# Patient Record
Sex: Male | Born: 1955
Health system: Southern US, Community
[De-identification: ages and names within clinical notes are randomized; demographics above are authoritative.]

## PROBLEM LIST (undated history)

## (undated) DIAGNOSIS — F419 Anxiety disorder, unspecified: Secondary | ICD-10-CM

## (undated) DIAGNOSIS — N189 Chronic kidney disease, unspecified: Secondary | ICD-10-CM

## (undated) DIAGNOSIS — R972 Elevated prostate specific antigen [PSA]: Secondary | ICD-10-CM

## (undated) DIAGNOSIS — E119 Type 2 diabetes mellitus without complications: Secondary | ICD-10-CM

## (undated) DIAGNOSIS — C61 Malignant neoplasm of prostate: Secondary | ICD-10-CM

## (undated) DIAGNOSIS — E7800 Pure hypercholesterolemia, unspecified: Secondary | ICD-10-CM

## (undated) DIAGNOSIS — G4730 Sleep apnea, unspecified: Secondary | ICD-10-CM

## (undated) DIAGNOSIS — I10 Essential (primary) hypertension: Secondary | ICD-10-CM

## (undated) HISTORY — DX: Sleep apnea, unspecified: G47.30

## (undated) HISTORY — DX: Type 2 diabetes mellitus without complications: E11.9

## (undated) HISTORY — PX: COLONOSCOPY: SHX174

## (undated) HISTORY — DX: Chronic kidney disease, unspecified: N18.9

## (undated) HISTORY — PX: PROSTATE BIOPSY: SHX241

## (undated) HISTORY — PX: OTHER SURGICAL HISTORY: SHX169

---

## 1998-06-06 ENCOUNTER — Ambulatory Visit (HOSPITAL_COMMUNITY): Admission: RE | Admit: 1998-06-06 | Discharge: 1998-06-06 | Payer: Self-pay | Admitting: Gastroenterology

## 1999-01-26 ENCOUNTER — Ambulatory Visit: Admission: RE | Admit: 1999-01-26 | Discharge: 1999-01-26 | Payer: Self-pay | Admitting: Internal Medicine

## 1999-06-29 ENCOUNTER — Ambulatory Visit: Admission: RE | Admit: 1999-06-29 | Discharge: 1999-06-29 | Payer: Self-pay | Admitting: Internal Medicine

## 2011-02-02 ENCOUNTER — Ambulatory Visit
Admission: RE | Admit: 2011-02-02 | Discharge: 2011-02-02 | Disposition: A | Discharge status: Home / Self Care | Payer: Self-pay | Source: Ambulatory Visit | Attending: *Deleted | Admitting: *Deleted

## 2011-02-02 ENCOUNTER — Other Ambulatory Visit: Payer: Self-pay | Admitting: *Deleted

## 2011-02-02 DIAGNOSIS — M549 Dorsalgia, unspecified: Secondary | ICD-10-CM

## 2011-02-02 DIAGNOSIS — M542 Cervicalgia: Secondary | ICD-10-CM

## 2013-04-20 ENCOUNTER — Emergency Department (HOSPITAL_COMMUNITY)
Admission: EM | Admit: 2013-04-20 | Discharge: 2013-04-20 | Disposition: A | Discharge status: Home / Self Care | Payer: BC Managed Care – PPO | Attending: Emergency Medicine | Admitting: Emergency Medicine

## 2013-04-20 ENCOUNTER — Emergency Department (HOSPITAL_COMMUNITY): Payer: BC Managed Care – PPO

## 2013-04-20 ENCOUNTER — Encounter (HOSPITAL_COMMUNITY): Payer: Self-pay

## 2013-04-20 DIAGNOSIS — I10 Essential (primary) hypertension: Secondary | ICD-10-CM | POA: Insufficient documentation

## 2013-04-20 DIAGNOSIS — N1330 Unspecified hydronephrosis: Secondary | ICD-10-CM | POA: Insufficient documentation

## 2013-04-20 DIAGNOSIS — E7800 Pure hypercholesterolemia, unspecified: Secondary | ICD-10-CM | POA: Insufficient documentation

## 2013-04-20 DIAGNOSIS — F411 Generalized anxiety disorder: Secondary | ICD-10-CM | POA: Insufficient documentation

## 2013-04-20 DIAGNOSIS — Z79899 Other long term (current) drug therapy: Secondary | ICD-10-CM | POA: Insufficient documentation

## 2013-04-20 DIAGNOSIS — N132 Hydronephrosis with renal and ureteral calculous obstruction: Secondary | ICD-10-CM

## 2013-04-20 DIAGNOSIS — Z7982 Long term (current) use of aspirin: Secondary | ICD-10-CM | POA: Insufficient documentation

## 2013-04-20 DIAGNOSIS — N201 Calculus of ureter: Secondary | ICD-10-CM | POA: Insufficient documentation

## 2013-04-20 HISTORY — DX: Anxiety disorder, unspecified: F41.9

## 2013-04-20 HISTORY — DX: Pure hypercholesterolemia, unspecified: E78.00

## 2013-04-20 HISTORY — DX: Essential (primary) hypertension: I10

## 2013-04-20 LAB — COMPREHENSIVE METABOLIC PANEL
ALT: 30 U/L (ref 0–53)
BUN: 20 mg/dL (ref 6–23)
CO2: 22 mEq/L (ref 19–32)
Calcium: 10.1 mg/dL (ref 8.4–10.5)
Creatinine, Ser: 0.8 mg/dL (ref 0.50–1.35)
GFR calc Af Amer: >90 mL/min (ref >90)
GFR calc non Af Amer: >90 mL/min (ref >90)
Glucose, Bld: 152 mg/dL — ABNORMAL HIGH (ref 70–99)

## 2013-04-20 LAB — CBC WITH DIFFERENTIAL/PLATELET
Eosinophils Relative: 1 % (ref 0–5)
HCT: 40.4 % (ref 39.0–52.0)
Lymphocytes Relative: 19 % (ref 12–46)
Lymphs Abs: 1.8 10*3/uL (ref 0.7–4.0)
MCH: 32.5 pg (ref 26.0–34.0)
MCV: 88.8 fL (ref 78.0–100.0)
Monocytes Absolute: 0.4 10*3/uL (ref 0.1–1.0)
Monocytes Relative: 4 % (ref 3–12)
RBC: 4.55 MIL/uL (ref 4.22–5.81)
WBC: 9.6 10*3/uL (ref 4.0–10.5)

## 2013-04-20 LAB — URINE MICROSCOPIC-ADD ON

## 2013-04-20 LAB — URINALYSIS, ROUTINE W REFLEX MICROSCOPIC
Bilirubin Urine: NEGATIVE
Nitrite: NEGATIVE
Specific Gravity, Urine: 1.024 (ref 1.005–1.030)
Urobilinogen, UA: 0.2 mg/dL (ref 0.0–1.0)

## 2013-04-20 MED ORDER — HYDROMORPHONE HCL PF 1 MG/ML IJ SOLN
1.0000 mg | Freq: Once | INTRAMUSCULAR | Status: AC
  Administered 2013-04-20: 1 mg via INTRAVENOUS
  Filled 2013-04-20: qty 1

## 2013-04-20 MED ORDER — FENTANYL CITRATE 0.05 MG/ML IJ SOLN
50.0000 ug | Freq: Once | INTRAMUSCULAR | Status: AC
  Administered 2013-04-20: 50 ug via INTRAVENOUS
  Filled 2013-04-20: qty 2

## 2013-04-20 MED ORDER — OXYCODONE-ACETAMINOPHEN 5-325 MG PO TABS: 1.0000 | ORAL_TABLET | Freq: Four times a day (QID) | ORAL | Status: DC | PRN

## 2013-04-20 MED ORDER — TAMSULOSIN HCL 0.4 MG PO CAPS: 0.4000 mg | ORAL_CAPSULE | Freq: Every day | ORAL | Status: DC

## 2013-04-20 MED ORDER — ONDANSETRON HCL 4 MG PO TABS: 4.0000 mg | ORAL_TABLET | Freq: Three times a day (TID) | ORAL | Status: DC | PRN

## 2013-04-20 MED ORDER — ONDANSETRON HCL 4 MG/2ML IJ SOLN
4.0000 mg | Freq: Once | INTRAMUSCULAR | Status: AC
  Administered 2013-04-20: 4 mg via INTRAVENOUS
  Filled 2013-04-20: qty 2

## 2013-04-20 MED ORDER — KETOROLAC TROMETHAMINE 30 MG/ML IJ SOLN
30.0000 mg | Freq: Once | INTRAMUSCULAR | Status: AC
  Administered 2013-04-20: 30 mg via INTRAVENOUS
  Filled 2013-04-20: qty 1

## 2013-04-20 NOTE — ED Provider Notes (Signed)
I saw and evaluated the patient, reviewed the resident's note and I agree with the findings and plan.  Patient seen and examined. Here at the right side colicky flank pain concerning for kidney stones. Will order CT and await results  Toy Baker, MD 04/20/13 1811

## 2013-04-20 NOTE — ED Notes (Signed)
Pt c/o sudden onset of Right flank pain x2 hours, pt denies any problems with urinating, denies hx of kidney stones. Pt is pale and diaphoretic

## 2013-04-20 NOTE — ED Provider Notes (Signed)
History     CSN: [id number]  Arrival date & time 04/20/13  1623   First MD Initiated Contact with Patient 04/20/13 1739      Chief Complaint  Patient presents with  . Flank Pain    (Consider location/radiation/quality/duration/timing/severity/associated sxs/prior treatment) HPI Comments: Mr. Keziah is a 57 year old white male with PMH of HTN presenting to the ED today for acute onset right sided flank/hip pain x3 hours.  He says he was driving from work today and felt sudden sharp right sided pain, constant, 8/10, no improvement with 2 otc ibuprofen, and no other alleviating or exacerbating factors.  No N/V/D, fever, chills, dysuria, hematuria, abdominal pain, numbness and tingling, chest pain, or shortness of breath at this time.  He had a similar episode of sudden right sided pain last week which spontaneously resolved after a couple of hours but the pain was not as severe he claims.  He explains the pain feels deep into his muscle and no tenderness on palpation to the area.  He denies any recent trauma to the area, no rapid change in movement, no hx of renal stones.    Patient is a 57 y.o. male presenting with flank pain. The history is provided by the patient. No language interpreter was used.  Flank Pain This is a recurrent problem. The current episode started today. The problem occurs intermittently. The problem has been rapidly worsening. Associated symptoms include diaphoresis. Nothing aggravates the symptoms. He has tried NSAIDs for the symptoms. The treatment provided no relief.    Past Medical History  Diagnosis Date  . Hypertension   . High cholesterol   . Anxiety     Past Surgical History  Procedure Laterality Date  . Arm surgery      History reviewed. No pertinent family history.  History  Substance Use Topics  . Smoking status: Never Smoker   . Smokeless tobacco: Not on file  . Alcohol Use: No      Review of Systems  Constitutional: Positive for  diaphoresis.  HENT: Negative.   Eyes: Negative.   Respiratory: Negative.   Cardiovascular: Negative.   Gastrointestinal: Negative.   Endocrine: Negative.   Genitourinary: Positive for flank pain.       Right flank pain  Musculoskeletal: Negative.        Right flank pain  Skin: Negative.   Allergic/Immunologic: Negative.   Neurological: Negative.   Hematological: Negative.   Psychiatric/Behavioral: Negative.     Allergies  Codeine  Home Medications   Current Outpatient Rx  Name  Route  Sig  Dispense  Refill  . aspirin EC 81 MG tablet   Oral   Take 81 mg by mouth daily.         Marland Kitchen atorvastatin (LIPITOR) 80 MG tablet   Oral   Take 80 mg by mouth daily.         . Multiple Vitamin (MULTIVITAMIN WITH MINERALS) TABS   Oral   Take 1 tablet by mouth daily.         Marland Kitchen PARoxetine (PAXIL) 20 MG tablet   Oral   Take 20 mg by mouth every morning.         . ramipril (ALTACE) 5 MG tablet   Oral   Take 5 mg by mouth daily.         . ondansetron (ZOFRAN) 4 MG tablet   Oral   Take 1 tablet (4 mg total) by mouth every 8 (eight) hours as needed for nausea.  20 tablet   0   . oxyCODONE-acetaminophen (ROXICET) 5-325 MG per tablet   Oral   Take 1-2 tablets by mouth every 6 (six) hours as needed for pain.   20 tablet   0   . tamsulosin (FLOMAX) 0.4 MG CAPS   Oral   Take 1 capsule (0.4 mg total) by mouth daily after supper.   14 capsule   0     BP 178/82  Pulse 62  Temp(Src) 98.8 F (37.1 C) (Oral)  Resp 17  SpO2 97%  Physical Exam  Constitutional: He is oriented to person, place, and time. He appears well-developed and well-nourished.  HENT:  Head: Normocephalic and atraumatic.  Eyes: Conjunctivae and EOM are normal. Pupils are equal, round, and reactive to light.  Neck: Normal range of motion. Neck supple.  Cardiovascular: Normal rate, regular rhythm, normal heart sounds and intact distal pulses.   Pulmonary/Chest: Effort normal and breath sounds  normal.  Abdominal: Soft. Bowel sounds are normal. He exhibits no distension. There is tenderness.  Mild tenderness to deep palpation RLQ  Musculoskeletal: Normal range of motion. He exhibits no edema and no tenderness.  Neurological: He is alert and oriented to person, place, and time.  Skin: Skin is warm and dry. No rash noted.  Psychiatric: He has a normal mood and affect. His behavior is normal. Judgment and thought content normal.    ED Course  Procedures (including critical care time)  Labs Reviewed  CBC WITH DIFFERENTIAL - Abnormal; Notable for the following:    MCHC 36.6 (*)    All other components within normal limits  COMPREHENSIVE METABOLIC PANEL - Abnormal; Notable for the following:    Glucose, Bld 152 (*)    All other components within normal limits  URINALYSIS, ROUTINE W REFLEX MICROSCOPIC - Abnormal; Notable for the following:    APPearance HAZY (*)    Glucose, UA 100 (*)    Hgb urine dipstick MODERATE (*)    All other components within normal limits  URINE MICROSCOPIC-ADD ON - Abnormal; Notable for the following:    Squamous Epithelial / LPF FEW (*)    All other components within normal limits   Ct Abdomen Pelvis Wo Contrast  04/20/2013  *RADIOLOGY REPORT*  Clinical Data: Right flank pain for several hours  CT ABDOMEN AND PELVIS WITHOUT CONTRAST  Technique:  Multidetector CT imaging of the abdomen and pelvis was performed following the standard protocol without intravenous contrast.  Comparison: None.  Findings: The lung bases are clear.  The liver is unremarkable in the unenhanced state.  No calcified gallstones are seen.  The pancreas is normal in size and the pancreatic duct is not dilated. The adrenal glands and spleen are unremarkable. The stomach is fluid distended containing food debris.  The duodenum does not appear to cross midline, and the cecum is somewhat medially positioned, suggesting a degree of intestinal malrotation.  No left renal calculi are seen and  there is no left hydronephrosis noted.  However, there is moderate right hydronephrosis present with right hydroureter.  The right ureter is dilated to a point of partial obstruction by a 4 mm distal right ureteral calculus very near the right UV junction.  The urinary bladder is not well distended but is unremarkable.  The prostate is normal in size containing several calcifications.  No free fluid is seen within the pelvis. The appendix is unremarkable. Degenerative disc disease is noted at L3-4 and L4-5 levels.  IMPRESSION:  1.  Moderate right hydronephrosis  and hydroureter to a point of obstruction by a 4 mm distal right ureteral calculus near the right UV junction. 2.  No additional renal calculi are seen. 3.  Probable partial intestinal malrotation.  No obstruction.   Original Report Authenticated By: Dwyane Dee, M.D.      1. Ureteral stone with hydronephrosis       MDM  57 year old male presenting with right flank pain x3 hours. Found to have hydronephrosis and R ureteral calculus with obstruction on CT abdomen.   -CT abdomen and pelvis: Moderate right hydronephrosis and hydroureter to a point of obstruction by a 4 mm distal right ureteral calculus near the right UV junction.  Probable partial intestinal malrotation. No obstruction. -u/a: moderate Hgb, 11-20 rbc, negative nitrites and leukocytes -fentanyl IV x1 no relief -dilaudid 1mg  IV x1 -Toradol 30mg  IV x1 -pain now well controlled -d/c home with follow up advised with pcp and urology as soon as possible.  Percocet prn pain, zofran prn nausea, and tamsulosin.  Case discussed with Dr. Lonn Georgia, MD 04/20/13 2137

## 2013-04-20 NOTE — ED Notes (Signed)
C/O severe pain in his right  Flank. States that it began 3 hours ago. C/O some nausea and occasional diaphoresis. Denies any strain injury. Area is non-tender to palpation.  No abdominal tenderness on palpation. States that movement does not affect his pain level.  States that it just remains constant.

## 2013-04-21 NOTE — ED Provider Notes (Signed)
I saw and evaluated the patient, reviewed the resident's note and I agree with the findings and plan.  Toy Baker, MD 04/21/13 [phone]

## 2013-10-14 ENCOUNTER — Encounter: Payer: Self-pay | Admitting: Internal Medicine

## 2013-12-11 ENCOUNTER — Ambulatory Visit (AMBULATORY_SURGERY_CENTER): Payer: Self-pay | Admitting: *Deleted

## 2013-12-11 VITALS — Ht 71.5 in | Wt 214.2 lb

## 2013-12-11 DIAGNOSIS — Z1211 Encounter for screening for malignant neoplasm of colon: Secondary | ICD-10-CM

## 2013-12-11 MED ORDER — PEG-KCL-NACL-NASULF-NA ASC-C 100 G PO SOLR: ORAL | Status: DC

## 2013-12-11 NOTE — Progress Notes (Signed)
No egg or soy allergy  Pt had colonoscopy in 1999 with Dr. Oletta Lamas- release of info formed filled out and give to [place], [place]

## 2013-12-16 ENCOUNTER — Encounter: Payer: Self-pay | Admitting: Internal Medicine

## 2013-12-25 ENCOUNTER — Encounter: Payer: BC Managed Care – PPO | Admitting: Internal Medicine

## 2014-02-05 ENCOUNTER — Encounter: Payer: BC Managed Care – PPO | Admitting: Internal Medicine

## 2014-03-09 ENCOUNTER — Encounter: Payer: BC Managed Care – PPO | Admitting: Internal Medicine

## 2014-03-09 ENCOUNTER — Ambulatory Visit (AMBULATORY_SURGERY_CENTER): Payer: BC Managed Care – PPO | Admitting: Internal Medicine

## 2014-03-09 ENCOUNTER — Encounter: Payer: Self-pay | Admitting: Internal Medicine

## 2014-03-09 VITALS — BP 132/70 | HR 50 | Temp 98.7°F | Resp 16 | Ht 71.5 in | Wt 214.0 lb

## 2014-03-09 DIAGNOSIS — D126 Benign neoplasm of colon, unspecified: Secondary | ICD-10-CM

## 2014-03-09 DIAGNOSIS — Z800 Family history of malignant neoplasm of digestive organs: Secondary | ICD-10-CM

## 2014-03-09 DIAGNOSIS — Z1211 Encounter for screening for malignant neoplasm of colon: Secondary | ICD-10-CM

## 2014-03-09 MED ORDER — SODIUM CHLORIDE 0.9 % IV SOLN: 500.0000 mL | INTRAVENOUS | Status: DC

## 2014-03-09 NOTE — Patient Instructions (Signed)
YOU HAD AN ENDOSCOPIC PROCEDURE TODAY AT THE [facility]: Refer to the procedure report that was given to you for any specific questions about what was found during the examination.  If the procedure report does not answer your questions, please call your gastroenterologist to clarify.  If you requested that your care partner not be given the details of your procedure findings, then the procedure report has been included in a sealed envelope for you to review at your convenience later.  YOU SHOULD EXPECT: Some feelings of bloating in the abdomen. Passage of more gas than usual.  Walking can help get rid of the air that was put into your GI tract during the procedure and reduce the bloating. If you had a lower endoscopy (such as a colonoscopy or flexible sigmoidoscopy) you may notice spotting of blood in your stool or on the toilet paper. If you underwent a bowel prep for your procedure, then you may not have a normal bowel movement for a few days.  DIET: Your first meal following the procedure should be a light meal and then it is ok to progress to your normal diet.  A half-sandwich or bowl of soup is an example of a good first meal.  Heavy or fried foods are harder to digest and may make you feel nauseous or bloated.  Likewise meals heavy in dairy and vegetables can cause extra gas to form and this can also increase the bloating.  Drink plenty of fluids but you should avoid alcoholic beverages for 24 hours.  ACTIVITY: Your care partner should take you home directly after the procedure.  You should plan to take it easy, moving slowly for the rest of the day.  You can resume normal activity the day after the procedure however you should NOT DRIVE or use heavy machinery for 24 hours (because of the sedation medicines used during the test).    SYMPTOMS TO REPORT IMMEDIATELY: A gastroenterologist can be reached at any hour.  During normal business hours, 8:30 AM to 5:00 PM Monday through Friday,  call [phone].  After hours and on weekends, please call the GI answering service at [phone] who will take a message and have the physician on call contact you.   Following lower endoscopy (colonoscopy or flexible sigmoidoscopy):  Excessive amounts of blood in the stool  Significant tenderness or worsening of abdominal pains  Swelling of the abdomen that is new, acute  Fever of 100F or higher    FOLLOW UP: If any biopsies were taken you will be contacted by phone or by letter within the next 1-3 weeks.  Call your gastroenterologist if you have not heard about the biopsies in 3 weeks.  Our staff will call the home number listed on your records the next business day following your procedure to check on you and address any questions or concerns that you may have at that time regarding the information given to you following your procedure. This is a courtesy call and so if there is no answer at the home number and we have not heard from you through the emergency physician on call, we will assume that you have returned to your regular daily activities without incident.  SIGNATURES/CONFIDENTIALITY: You and/or your care partner have signed paperwork which will be entered into your electronic medical record.  These signatures attest to the fact that that the information above on your After Visit Summary has been reviewed and is understood.  Full responsibility of the confidentiality   of this discharge information lies with you and/or your care-partner.     

## 2014-03-09 NOTE — Progress Notes (Signed)
Called to room to assist during endoscopic procedure.  Patient ID and intended procedure confirmed with present staff. Received instructions for my participation in the procedure from the performing physician.  

## 2014-03-09 NOTE — Op Note (Addendum)
[place]  [address]. [place], [address]   COLONOSCOPY PROCEDURE REPORT  PATIENT: Ronnie Brewer, Ronnie Brewer  MR#: [id number] BIRTHDATE: 06-25-1956 , 48  yrs. old GENDER: Male ENDOSCOPIST: Lafayette Dragon, MD REFERRED EX:[id number] Avva, M.D. PROCEDURE DATE:  03/09/2014 PROCEDURE:   Colonoscopy with cold biopsy polypectomy First Screening Colonoscopy - Avg.  risk and is 50 yrs.  old or older - No.  Prior Negative Screening - Now for repeat screening. 10 or more years since last screening Prior Negative Screening - Now for repeat screening.  Above average risk  History of Adenoma - Now for follow-up colonoscopy & has been > or = to 3 yrs.  N/A Polyps Removed Today? Yes. ASA CLASS:   Class II INDICATIONS:Patient's immediate family history of colon cancer and mother with colon cancer.  Prior colonoscopy in 1999 and 2006 O-.  MEDICATIONS: MAC sedation, administered by CRNA and propofol (Diprivan) 450mg  IV  DESCRIPTION OF PROCEDURE:   After the risks benefits and alternatives of the procedure were thoroughly explained, informed consent was obtained.  A digital rectal exam revealed no abnormalities of the rectum.   The LB [id number]  endoscope was introduced through the anus and advanced to the cecum, which was identified by both the appendix and ileocecal valve. No adverse events experienced.   The quality of the prep was good, using MoviPrep  The instrument was then slowly withdrawn as the colon was fully examined.      COLON FINDINGS: Two sessile polyps ranging between 3-76mm in size were found in the sigmoid colon and ascending colon.  A polypectomy was performed with cold forceps.  The resection was complete and the polyp tissue was completely retrieved.  Retroflexed views revealed no abnormalities. The time to cecum=10 minutes 56 seconds. Withdrawal time=7 minutes 59 seconds.  The scope was withdrawn and the procedure completed. COMPLICATIONS: There were  no complications.  ENDOSCOPIC IMPRESSION: Two sessile polyps ranging between 3-79mm in size were found in the sigmoid colon and ascending colon; polypectomy was performed with cold forceps  RECOMMENDATIONS: 1.  Await pathology results 2.  high fiber diet Recall colonoscopy in 5 years   eSigned:  Lafayette Dragon, MD 03/09/2014 3:23 PM Revised: 03/09/2014 3:23 PMAddendum: we were unble to capture photodocumentation of the cecum and ileocecal valve which were fully visualized  cc:

## 2014-03-10 ENCOUNTER — Telehealth: Payer: Self-pay | Admitting: *Deleted

## 2014-03-10 NOTE — Telephone Encounter (Signed)
Left message that we called for f/u 

## 2014-03-16 ENCOUNTER — Encounter: Payer: Self-pay | Admitting: Internal Medicine

## 2018-02-06 ENCOUNTER — Encounter: Payer: Self-pay | Admitting: Neurology

## 2018-02-09 ENCOUNTER — Encounter: Payer: Self-pay | Admitting: Neurology

## 2018-02-10 ENCOUNTER — Ambulatory Visit: Payer: Self-pay | Admitting: Neurology

## 2018-02-10 ENCOUNTER — Encounter: Payer: Self-pay | Admitting: Neurology

## 2018-02-10 VITALS — BP 110/59 | HR 65 | Ht 71.0 in | Wt 195.0 lb

## 2018-02-10 DIAGNOSIS — G4733 Obstructive sleep apnea (adult) (pediatric): Secondary | ICD-10-CM

## 2018-02-10 DIAGNOSIS — G4739 Other sleep apnea: Secondary | ICD-10-CM

## 2018-02-10 DIAGNOSIS — Z9989 Dependence on other enabling machines and devices: Secondary | ICD-10-CM

## 2018-02-10 DIAGNOSIS — E1165 Type 2 diabetes mellitus with hyperglycemia: Secondary | ICD-10-CM

## 2018-02-10 DIAGNOSIS — G4731 Primary central sleep apnea: Secondary | ICD-10-CM

## 2018-02-10 DIAGNOSIS — I119 Hypertensive heart disease without heart failure: Secondary | ICD-10-CM

## 2018-02-10 NOTE — Progress Notes (Signed)
SLEEP MEDICINE CLINIC   Provider:  Larey Seat, M D  Primary Care Physician:  Prince Solian, MD   Referring Provider: Prince Solian, MD   Chief Complaint  Patient presents with  . New Patient (Initial Visit)    pt alone, rm  11, cpap user for 25 years. pt has only had 2 machines in the last 2 years current machine is about 63 years old. [facility] is where he gets supplies    HPI:  Ronnie Brewer is a 62 y.o. male , seen here in a referral from Dr. Dagmar Hait for an evaluation of sleep apnea.  He developed excessive daytime sleepiness many years ago. He has had 2 sleep studies : one  25 years ago at the hospital, and about 12 years ago another one. He now is in a position where he cannot get supplies for his now over 34 year old CPAP machine. Dr. Dagmar Hait had repeated an ONO with him before the second study, and felt that CPAP corrected his apnea for all these years sufficiently. His wife, [name], has been our patient and he chose to come here for this work up.   I have access to a compliance report, which shows that the patient is 100% compliant user including the last day of 3 March of this year.  He has used the machine 29 out of 30 days over 4 hours consecutively, average use of time is 6 hours and 12 minutes, set pressure is 10 cmH2O was 2 cm EPR, residual AHI is ideal 2.7 only.  He does have high air leaks currently and this may be related to the age of his mask.  I also noted that he has more residual central apneas and obstructive apneas this would indicate that he may have too high a pressure set.   What we are going to do is to test him again for his current level of apnea and then either to up titrate or invite him for a CPAP titration depending on his insurance.  He had recent labs and I reviewed these and his med list.   He has Nocturia, polydipsia, Diabetes type 2, had kidney stones. Pneumonia in 2016, Mitral regurgitation, LVH, normal EF %.   Chief complaint according to patient  : I need supplies  Sleep habits are as follows: The patient's usual bedtime is around midnight and he does not have trouble to fall asleep.  He usually watches TV before he retreats to the bedroom, which is cool, quiet and dark and he shares it with his wife. She has noted at times that Mr. Dorwart is snoring or at least audibly breathing in spite of using the CPAP compliantly.  He has no nocturia since Dr. Elsworth Soho started him on metformin, the urinary frequency was a response to glucosuria. He sleeps through the night, wakes spontanously at 7.30 AM, just before his alarm rings. Their  3rd eldest daughter ( 78 )  lives with them since she suffered a TBI in a MVA at age 68 . Her sisters all live close by.    Sleep medical history and family sleep history:  Diagnosed at age 43 with OSA, wife has OSA both use CPAP. Allergic rhinitis, hyperchol, hypertension, DM2.   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    Review of Systems: Out of a complete 14 system review, the patient complains of only the following symptoms, and all other reviewed systems are negative.  Snoring on  CPAP  Epworth score  6/ 24 on CPAP  , Fatigue severity score 12   , depression score not clinically significant now.    Social History   Socioeconomic History  . Marital status: Married    Spouse name: Not on file  . Number of children: Not on file  . Years of education: Not on file  . Highest education level: Not on file  Social Needs  . Financial resource strain: Not on file  . Food insecurity - worry: Not on file  . Food insecurity - inability: Not on file  . Transportation needs - medical: Not on file  . Transportation needs - non-medical: Not on file  Occupational History  . Not on file  Tobacco Use  . Smoking status: Never Smoker  . Smokeless tobacco: Never Used  Substance and Sexual Activity  . Alcohol use: No  . Drug use: No  . Sexual activity: Not on file  Other  Topics Concern  . Not on file  Social History Narrative  . Not on file    Family History  Problem Relation Age of Onset  . Colon cancer Mother   . Hypertension Mother   . Hypertension Father   . Nephrolithiasis Father   . Ulcers Father   . Diabetes Mellitus II Father   . Esophageal cancer Neg Hx   . Rectal cancer Neg Hx   . Stomach cancer Neg Hx     Past Medical History:  Diagnosis Date  . Anxiety   . Chronic kidney disease    hx kidney stones- 2014  . Diabetes mellitus without complication ([place])   . High cholesterol   . Hypertension   . Sleep apnea    wears CPAP    Past Surgical History:  Procedure Laterality Date  . arm surgery     1981  . COLONOSCOPY      Current Outpatient Medications  Medication Sig Dispense Refill  . aspirin EC 81 MG tablet Take 81 mg by mouth daily.    Marland Kitchen atorvastatin (LIPITOR) 80 MG tablet Take 80 mg by mouth daily.    . metFORMIN (GLUCOPHAGE) 500 MG tablet 1,000 mg 2 (two) times daily with breakfast and lunch.  6  . Multiple Vitamin (MULTIVITAMIN WITH MINERALS) TABS Take 1 tablet by mouth daily.    Marland Kitchen PARoxetine (PAXIL) 20 MG tablet Take 20 mg by mouth every morning.    . ramipril (ALTACE) 5 MG tablet Take 5 mg by mouth daily.    Marland Kitchen telmisartan (MICARDIS) 80 MG tablet Take 80 mg by mouth daily.  6   No current facility-administered medications for this visit.     Allergies as of 02/10/2018 - Review Complete 02/10/2018  Allergen Reaction Noted  . Codeine Nausea And Vomiting 04/20/2013    Vitals: BP (!) 110/59   Pulse 65   Ht 5\' 11"  (1.803 m)   Wt 195 lb (88.5 kg)   BMI 27.20 kg/m  Last Weight:  Wt Readings from Last 1 Encounters:  02/10/18 195 lb (88.5 kg)   VQM:GQQP mass index is 27.2 kg/m.     Last Height:   Ht Readings from Last 1 Encounters:  02/10/18 5\' 11"  (1.803 m)    Physical exam:  General: The patient is awake, alert and appears not in acute distress. The patient is well groomed. Head: Normocephalic,  atraumatic. Neck is supple. Mallampati 3- tip of uvula touches tongue ground. ,  neck circumference:17.5. Nasal airflow congested , bilateral  TMJ click ,  no bruxism marks.  Retrognathia is not seen.  Cardiovascular:  Regular rate and rhythm , without  murmurs or carotid bruit, and without distended neck veins. Respiratory: Lungs are clear to auscultation.Skin:  Without evidence of edema, or rash. Trunk: BMI is 27.2. The patient's posture is erect.   Neurologic exam :The patient is awake and alert, oriented to place and time.  Memory subjective described as intact.   Attention span & concentration ability appears normal.  Speech is fluent,  without  dysarthria, mild dysphonia and no aphasia.  Mood and affect are appropriate.  Cranial nerves: Pupils are equal and briskly reactive to light and accomodation. Funduscopic exam without evidence of pallor or edema- .  Extraocular movements  in vertical and horizontal planes intact and without nystagmus. Visual fields by finger perimetry are intact. Hearing to finger rub intact. Facial sensation intact to fine touch. Facial motor strength is symmetric and tongue and uvula move midline. Shoulder shrug was symmetrical.   Motor exam: Normal tone, muscle bulk and symmetric strength in all extremities. Strong grip bilateral.  Sensory:  Fine touch, pinprick and vibration / Proprioception tested in the upper extremities was normal. Coordination: Rapid alternating movements / Finger-to-nose maneuver  normal without evidence of ataxia, dysmetria or tremor. Gait and station:  Raises without bracing himself Patient walks without assistive device.Tandem gait is unfragmented. Turns with 3 Steps. Deep tendon reflexes: in the  upper and lower extremities are symmetric and intact.  Assessment:  After physical and neurologic examination, review of laboratory studies,  Personal review of imaging studies, reports of other /same  Imaging studies, results of  polysomnography and / or neurophysiology testing and pre-existing records as far as provided in visit., my assessment is   1)  He needs a new sleep study to confirm his diagnosis is still present. His machine works fine and was WiFi accessible for data. He is highly compliant user.  His snoring on CPAP could indicate his pressures not be high enough or his mask not longer fitting. He has lost weight on metformin 12-15  pounds. This may contribute to a looser fit.   2) AETNA- will need a HST -  and confirm level of current apnea. Is there really central apnea present?   3) will not necessarily need a new CPAP, but an auto titration for a month to allow resetting the current machine.   The patient was advised of the nature of the diagnosed disorder , the treatment options and the  risks for general health and wellness arising from not treating the condition.  I spent more than 45  minutes of face to face time with the patient. Greater than 50% of time was spent in counseling and coordination of care. We have discussed the diagnosis and differential and I answered the patient's questions.        No orders of the defined types were placed in this encounter.    No orders of the defined types were placed in this encounter.    No Follow-up on file.   Larey Seat, MD 7/0/1779, 39:03 AM  Certified in Neurology by ABPN Certified in [place] by [facility] [address] [place], [place] [address]

## 2018-03-12 ENCOUNTER — Ambulatory Visit (INDEPENDENT_AMBULATORY_CARE_PROVIDER_SITE_OTHER): Payer: 59 | Admitting: Neurology

## 2018-03-12 DIAGNOSIS — E1165 Type 2 diabetes mellitus with hyperglycemia: Secondary | ICD-10-CM

## 2018-03-12 DIAGNOSIS — G4739 Other sleep apnea: Secondary | ICD-10-CM

## 2018-03-12 DIAGNOSIS — Z9989 Dependence on other enabling machines and devices: Secondary | ICD-10-CM

## 2018-03-12 DIAGNOSIS — G4733 Obstructive sleep apnea (adult) (pediatric): Secondary | ICD-10-CM

## 2018-03-12 DIAGNOSIS — I119 Hypertensive heart disease without heart failure: Secondary | ICD-10-CM

## 2018-03-12 DIAGNOSIS — G4731 Primary central sleep apnea: Secondary | ICD-10-CM

## 2018-03-14 DIAGNOSIS — G4739 Other sleep apnea: Secondary | ICD-10-CM | POA: Insufficient documentation

## 2018-03-14 DIAGNOSIS — I119 Hypertensive heart disease without heart failure: Secondary | ICD-10-CM | POA: Insufficient documentation

## 2018-03-14 DIAGNOSIS — E1165 Type 2 diabetes mellitus with hyperglycemia: Secondary | ICD-10-CM | POA: Insufficient documentation

## 2018-03-14 DIAGNOSIS — G4731 Primary central sleep apnea: Secondary | ICD-10-CM

## 2018-03-14 NOTE — Procedures (Signed)
[facility] [place], [place] [address] NAME:  Ronnie Brewer                                                                    DOB: [birth year] MEDICAL RECORD No:  [id number]                                               DOS: 03/12/2018 REFERRING PHYSICIAN: Prince Solian, M.D. STUDY PERFORMED: Home Sleep Study by apnea link HISTORY:  Ronnie Brewer is a 62 y.o. male, who developed excessive daytime sleepiness and has had 2 sleep studies: one 25 years ago and another 12 years ago, both confirming OSA. He now is in a position where he cannot get supplies for his now over 83 year old CPAP machine. Dr. Dagmar Hait had repeated an ONO with him before the second study, and felt that CPAP corrected his apnea for all these years sufficiently. His wife, [name], has been our patient and she reports he snores "through the CPAP" now and has a high air leak.  I have access to a CPAP compliance report, which shows that the patient is a highly compliant user including the last day of 3 March of this year.  He has used the machine 29 out of 30 days over 4 hours ( 97%) consecutively, average use of time is 6 hours and 12 minutes, set pressure is 10 cmH2O with 2 cm EPR, residual AHI is ideal at 2.7/h only.  .  We are now to test him again to confirm his current level of apnea and then either to up titrate or invite him for a CPAP titration depending on his insurance. Epworth score 6/ 24 on CPAP, BMI: 27.2  STUDY RESULTS:  Total Recording Time: 10 hours, 40 minutes. Total Apnea/Hypopnea Index (AHI):  21.8 /h, RDI was 25.1 /h. Average Oxygen Saturation:  89 %; Lowest Oxygen Desaturation: 79 %  Total Time Oxygen Saturation below 89% was 236 minutes!  Average Heart Rate: 49 bpm (between 43 and 122 bpm).  IMPRESSION: There is still moderate and mostly obstructive apnea noted, accompanied by moderate snoring. Central apnea amounted to 22 % ,unclassified apnea to 20%, and  obstructive apnea to 59%  The apnea link record shows hypoxemia for a prolonged period of time, and hypoxemia cannot be treated with dental devices or the inspire apparatus.  RECOMMENDATION: CPAP therapy should be continued in a patient with hypoxemia and documented hypertensive heart disease - The patient will be provided with an auto titration CPAP , pressure settings between 6 and 12 cm water, 2 cm EPR and with a mask of his choice. If treatment emergent central apneas are seen on future download will likely change to BiPAP.   I certify that I have reviewed the raw data recording prior to the issuance of this report in accordance with the standards of the American Academy of Sleep Medicine (AASM).  Larey Seat, M.D.  03-14-2018 Medical Director of [facility] at [facility], accredited by the AASM. Diplomat of the ABPN and ABSM.

## 2018-03-14 NOTE — Addendum Note (Signed)
Addended by: Larey Seat on: 03/14/2018 12:00 PM   Modules accepted: Orders

## 2018-03-17 ENCOUNTER — Telehealth: Payer: Self-pay

## 2018-03-17 NOTE — Telephone Encounter (Signed)
-----   Message from Larey Seat, MD sent at 03/14/2018 12:00 PM EDT ----- IMPRESSION: There is still moderate and mostly obstructive apnea  noted, accompanied by moderate snoring. Central apnea amounted to  22 % ,unclassified apnea to 20%, and obstructive apnea to 59%  The apnea link record shows hypoxemia for a prolonged period of  time, and hypoxemia cannot be treated with dental devices or the  inspire apparatus.  RECOMMENDATION: CPAP therapy should be continued in a patient  with hypoxemia and documented hypertensive heart disease - The  patient will be provided with an auto titration CPAP , pressure  settings between 6 and 12 cm water, 2 cm EPR and with a mask of  his choice.   We need to watch out for treatment emergent central apneas while on auto PAP> CD

## 2018-03-17 NOTE — Telephone Encounter (Signed)
I called pt to discuss his sleep study results. No answer, left a message asking him to call me back. 

## 2018-03-18 NOTE — Telephone Encounter (Signed)
I called pt. I advised pt that Dr. Brett Fairy reviewed their sleep study results and found that pt still has moderate osa with hypoxemia and some central apnea component. Dr. Brett Fairy recommends that pt start an auto pap at home and we will watch for central apnea while on auto pap. I reviewed PAP compliance expectations with the pt. Pt is agreeable to starting an auto-PAP. I advised pt that an order will be sent to a DME, [facility], and [facility] will call the pt within about one week after they file with the pt's insurance. [facility] will show the pt how to use the machine, fit for masks, and troubleshoot the auto-PAP if needed. A follow up appt was made for insurance purposes with Hoyle Sauer, NP on 06/25/18 at 8:45am. Pt verbalized understanding to arrive 15 minutes early and bring their auto-PAP. A letter with all of this information in it will be mailed to the pt as a reminder. I verified with the pt that the address we have on file is correct. Pt verbalized understanding of results. Pt had no questions at this time but was encouraged to call back if questions arise.

## 2018-06-23 ENCOUNTER — Encounter: Payer: Self-pay | Admitting: Nurse Practitioner

## 2018-06-24 NOTE — Progress Notes (Signed)
[facility]  PATIENT: UNO ESAU DOB: 1956-08-05   REASON FOR VISIT: Obstructive sleep apnea follow-up for AutoPap compliance HISTORY FROM: Patient   HISTORY OF PRESENT ILLNESS:UPDATE 7/17/2019CM Mr. Pfalzgraf, 62 year old male returns for follow-up with obstructive sleep apnea.  He is received a new machine and is here for CPAP compliance.  He is having no problems with his machine.  Compliance data dated 05/25/2018 to 06/23/2018 shows compliance greater than 4 hours at 100%.  Average usage 5 hours 58 minutes.  Set pressure 6 to 12 cm.  EPR level 2 AHI 1.4 he returns for reevaluation. 3/4/19CDKenneth R Mckamie is a 62 y.o. male , seen here in a referral from Dr. Dagmar Hait for an evaluation of sleep apnea.  He developed excessive daytime sleepiness many years ago. He has had 2 sleep studies : one  25 years ago at the hospital, and about 12 years ago another one. He now is in a position where he cannot get supplies for his now over 46 year old CPAP machine. Dr. Dagmar Hait had repeated an ONO with him before the second study, and felt that CPAP corrected his apnea for all these years sufficiently. His wife, [name], has been our patient and he chose to come here for this work up.   I have access to a compliance report, which shows that the patient is 100% compliant user including the last day of 3 March of this year.  He has used the machine 29 out of 30 days over 4 hours consecutively, average use of time is 6 hours and 12 minutes, set pressure is 10 cmH2O was 2 cm EPR, residual AHI is ideal 2.7 only.  He does have high air leaks currently and this may be related to the age of his mask.  I also noted that he has more residual central apneas and obstructive apneas this would indicate that he may have too high a pressure set.   What we are going to do is to test him again for his current level of apnea and then either to up titrate or invite him for a CPAP titration depending on his  insurance.  He had recent labs and I reviewed these and his med list.   He has Nocturia, polydipsia, Diabetes type 2, had kidney stones. Pneumonia in 2016, Mitral regurgitation, LVH, normal EF %.   Chief complaint according to patient : I need supplies  Sleep habits are as follows: The patient's usual bedtime is around midnight and he does not have trouble to fall asleep.  He usually watches TV before he retreats to the bedroom, which is cool, quiet and dark and he shares it with his wife. She has noted at times that Mr. Penny is snoring or at least audibly breathing in spite of using the CPAP compliantly.  He has no nocturia since Dr. Elsworth Soho started him on metformin, the urinary frequency was a response to glucosuria. He sleeps through the night, wakes spontanously at 7.30 AM, just before his alarm rings. Their  3rd eldest daughter ( 49 )  lives with them since she suffered a TBI in a MVA at age 73 . Her sisters all live close by.     REVIEW OF SYSTEMS: Full 14 system review of systems performed and notable only for those listed, all others are neg:  Constitutional: neg  Cardiovascular: neg Ear/Nose/Throat: neg  Skin: neg Eyes: neg Respiratory: neg Gastroitestinal: neg  Hematology/Lymphatic: neg  Endocrine: neg Musculoskeletal:neg Allergy/Immunology: neg Neurological: neg  Psychiatric: neg Sleep : Obstructive sleep apnea with CPAP   ALLERGIES: Allergies  Allergen Reactions  . Codeine Nausea And Vomiting    HOME MEDICATIONS: Outpatient Medications Prior to Visit  Medication Sig Dispense Refill  . aspirin EC 81 MG tablet Take 81 mg by mouth daily.    Marland Kitchen atorvastatin (LIPITOR) 80 MG tablet Take 80 mg by mouth daily.    . metFORMIN (GLUCOPHAGE) 500 MG tablet 1,000 mg 2 (two) times daily with breakfast and lunch.  6  . Multiple Vitamin (MULTIVITAMIN WITH MINERALS) TABS Take 1 tablet by mouth daily.    Marland Kitchen PARoxetine (PAXIL) 20 MG tablet Take 20 mg by mouth every morning.     . ramipril (ALTACE) 5 MG tablet Take 5 mg by mouth daily.    Marland Kitchen telmisartan (MICARDIS) 80 MG tablet Take 80 mg by mouth daily.  6   No facility-administered medications prior to visit.     PAST MEDICAL HISTORY: Past Medical History:  Diagnosis Date  . Anxiety   . Chronic kidney disease    hx kidney stones- 2014  . Diabetes mellitus without complication ([place])   . High cholesterol   . Hypertension   . Sleep apnea    wears CPAP    PAST SURGICAL HISTORY: Past Surgical History:  Procedure Laterality Date  . arm surgery     1981  . COLONOSCOPY      FAMILY HISTORY: Family History  Problem Relation Age of Onset  . Colon cancer Mother   . Hypertension Mother   . Hypertension Father   . Nephrolithiasis Father   . Ulcers Father   . Diabetes Mellitus II Father   . Esophageal cancer Neg Hx   . Rectal cancer Neg Hx   . Stomach cancer Neg Hx     SOCIAL HISTORY: Social History   Socioeconomic History  . Marital status: Married    Spouse name: Not on file  . Number of children: Not on file  . Years of education: Not on file  . Highest education level: Not on file  Occupational History    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  Social Needs  . Financial resource strain: Not on file  . Food insecurity:    Worry: Not on file    Inability: Not on file  . Transportation needs:    Medical: Not on file    Non-medical: Not on file  Tobacco Use  . Smoking status: Never Smoker  . Smokeless tobacco: Never Used  Substance and Sexual Activity  . Alcohol use: No  . Drug use: No  . Sexual activity: Not on file  Lifestyle  . Physical activity:    Days per week: Not on file    Minutes per session: Not on file  . Stress: Not on file  Relationships  . Social connections:    Talks on phone: Not on file    Gets together: Not on file    Attends religious service: Not on file    Active member of club or organization: Not on file    Attends meetings of clubs or organizations: Not  on file    Relationship status: Not on file  . Intimate partner violence:    Fear of current or ex partner: Not on file    Emotionally abused: Not on file    Physically abused: Not on file    Forced sexual activity: Not on file  Other Topics Concern  . Not on file  Social History Narrative  .  Not on file     PHYSICAL EXAM  Vitals:   06/25/18 0817  BP: (!) 94/50  Pulse: (!) 53  Weight: 193 lb 3.2 oz (87.6 kg)  Height: 5\' 11"  (1.803 m)   Body mass index is 26.95 kg/m.  Generalized: Well developed, in no acute distress  Head: normocephalic and atraumatic,. Oropharynx benign mallopatti3 Neck: Supple, circumference 17.5 Lungs clear Skin no peripheral edema Musculoskeletal: No deformity   Neurological examination   Mentation: Alert oriented to time, place, history taking. Attention span and concentration appropriate. Recent and remote memory intact.  Follows all commands speech and language fluent.   Cranial nerve II-XII: Pupils were equal round reactive to light extraocular movements were full, visual field were full on confrontational test. Facial sensation and strength were normal. hearing was intact to finger rubbing bilaterally. Uvula tongue midline. head turning and shoulder shrug were normal and symmetric.Tongue protrusion into cheek strength was normal. Motor: normal bulk and tone, full strength in the BUE, BLE, fine finger movements normal, no pronator drift. No focal weakness Sensory: normal and symmetric to light touch,  Coordination: finger-nose-finger, heel-to-shin bilaterally, no dysmetria Gait and Station: Rising up from seated position without assistance, normal stance,  moderate stride, good arm swing, smooth turning, able to perform tiptoe, and heel walking without difficulty. Tandem gait is steady  DIAGNOSTIC DATA (LABS, IMAGING, TESTING) - I reviewed patient records, labs, notes, testing and imaging myself where available.  Lab Results  Component Value  Date   WBC 9.6 04/20/2013   HGB 14.8 04/20/2013   HCT 40.4 04/20/2013   MCV 88.8 04/20/2013   PLT 186 04/20/2013      Component Value Date/Time   NA 141 04/20/2013 1642   K 3.7 04/20/2013 1642   CL 101 04/20/2013 1642   CO2 22 04/20/2013 1642   GLUCOSE 152 (H) 04/20/2013 1642   BUN 20 04/20/2013 1642   CREATININE 0.80 04/20/2013 1642   CALCIUM 10.1 04/20/2013 1642   PROT 7.8 04/20/2013 1642   ALBUMIN 5.2 04/20/2013 1642   AST 26 04/20/2013 1642   ALT 30 04/20/2013 1642   ALKPHOS 82 04/20/2013 1642   BILITOT 1.2 04/20/2013 1642   GFRNONAA >90 04/20/2013 1642   GFRAA >90 04/20/2013 1642    ASSESSMENT AND PLAN  62 y.o. year old male  has a past medical history of Anxiety, Chronic kidney disease, Diabetes mellitus without complication ([place]), High cholesterol, Hypertension, and Sleep apnea here to follow-up for CPAP compliance   CPAP compliance 100% Continue same settings Follow-up in 6 months then yearly Dennie Bible, [facility], [facility], [facility] [address] [place], [place] [address] [phone]

## 2018-06-25 ENCOUNTER — Encounter: Payer: Self-pay | Admitting: Nurse Practitioner

## 2018-06-25 ENCOUNTER — Ambulatory Visit: Payer: 59 | Admitting: Nurse Practitioner

## 2018-06-25 DIAGNOSIS — G4733 Obstructive sleep apnea (adult) (pediatric): Secondary | ICD-10-CM | POA: Diagnosis not present

## 2018-06-25 DIAGNOSIS — Z9989 Dependence on other enabling machines and devices: Secondary | ICD-10-CM | POA: Diagnosis not present

## 2018-06-25 NOTE — Patient Instructions (Signed)
CPAP compliance 100% Continue same settings Follow-up in 6 months then yearly

## 2018-07-11 NOTE — Progress Notes (Signed)
I agree with the assessment and plan as directed by NP .The patient is known to me .   Ronnie Clendenin, MD  

## 2018-08-13 NOTE — Progress Notes (Signed)
I agree with the assessment and plan as directed by NP .The patient is known to me .   Elmin Wiederholt, MD  

## 2018-10-06 DIAGNOSIS — G4733 Obstructive sleep apnea (adult) (pediatric): Secondary | ICD-10-CM | POA: Diagnosis not present

## 2018-11-06 DIAGNOSIS — G4733 Obstructive sleep apnea (adult) (pediatric): Secondary | ICD-10-CM | POA: Diagnosis not present

## 2018-11-19 DIAGNOSIS — Z23 Encounter for immunization: Secondary | ICD-10-CM | POA: Diagnosis not present

## 2018-12-06 DIAGNOSIS — G4733 Obstructive sleep apnea (adult) (pediatric): Secondary | ICD-10-CM | POA: Diagnosis not present

## 2019-01-05 ENCOUNTER — Encounter: Payer: Self-pay | Admitting: Neurology

## 2019-01-05 NOTE — Progress Notes (Signed)
[facility]  PATIENT: Ronnie Brewer DOB: [birth year]   REASON FOR VISIT: Obstructive sleep apnea follow-up for AutoPap compliance HISTORY FROM: Patient   HISTORY OF PRESENT ILLNESS:UPDATE 1/28/2020CM Ronnie Brewer, 63 year old male returns for follow-up with history of obstructive sleep apnea here for CPAP compliance.  He is having no problems compliance data dated 12/07/2018-01/05/2019 shows compliance greater than 4 hours at 100%.  Average usage 5 hours 59 minutes.  Set pressure 6 to 12 cm.  EPR level 2 AHI 1.7 he returns for reevaluation    UPDATE 7/17/2019CM Ronnie Brewer, 63 year old male returns for follow-up with obstructive sleep apnea.  He is received a new machine and is here for CPAP compliance.  He is having no problems with his machine.  Compliance data dated 05/25/2018 to 06/23/2018 shows compliance greater than 4 hours at 100%.  Average usage 5 hours 58 minutes.  Set pressure 6 to 12 cm.  EPR level 2 AHI 1.4 he returns for reevaluation. 3/4/19CDKenneth R Brewer is a 63 y.o. male , seen here in a referral from Ronnie Brewer for an evaluation of sleep apnea.  He developed excessive daytime sleepiness many years ago. He has had 2 sleep studies : one  25 years ago at the hospital, and about 12 years ago another one. He now is in a position where he cannot get supplies for his now over 23 year old CPAP machine. Ronnie Brewer had repeated an ONO with him before the second study, and felt that CPAP corrected his apnea for all these years sufficiently. His wife, Ronnie Brewer, has been our patient and he chose to come here for this work up.   I have access to a compliance report, which shows that the patient is 100% compliant user including the last day of 3 March of this year.  He has used the machine 29 out of 30 days over 4 hours consecutively, average use of time is 6 hours and 12 minutes, set pressure is 10 cmH2O was 2 cm EPR, residual AHI is ideal 2.7 only.  He does have high air leaks  currently and this may be related to the age of his mask.  I also noted that he has more residual central apneas and obstructive apneas this would indicate that he may have too high a pressure set.   What we are going to do is to test him again for his current level of apnea and then either to up titrate or invite him for a CPAP titration depending on his insurance.  He had recent labs and I reviewed these and his med list.   He has Nocturia, polydipsia, Diabetes type 2, had kidney stones. Pneumonia in 2016, Mitral regurgitation, LVH, normal EF %.   Chief complaint according to patient : I need supplies  Sleep habits are as follows: The patient's usual bedtime is around midnight and he does not have trouble to fall asleep.  He usually watches TV before he retreats to the bedroom, which is cool, quiet and dark and he shares it with his wife. She has noted at times that Ronnie Brewer is snoring or at least audibly breathing in spite of using the CPAP compliantly.  He has no nocturia since Ronnie Brewer started him on metformin, the urinary frequency was a response to glucosuria. He sleeps through the night, wakes spontanously at 7.30 AM, just before his alarm rings. Their  3rd eldest daughter ( 51 )  lives with them since she suffered a TBI in  a MVA at age 66 . Her sisters all live close by.     REVIEW OF SYSTEMS: Full 14 system review of systems performed and notable only for those listed, all others are neg:  Constitutional: neg  Cardiovascular: neg Ear/Nose/Throat: neg  Skin: neg Eyes: neg Respiratory: neg Gastroitestinal: neg  Hematology/Lymphatic: neg  Endocrine: neg Musculoskeletal:neg Allergy/Immunology: neg Neurological: neg Psychiatric: neg Sleep : Obstructive sleep apnea with CPAP   ALLERGIES: Allergies  Allergen Reactions  . Codeine Nausea And Vomiting    HOME MEDICATIONS: Outpatient Medications Prior to Visit  Medication Sig Dispense Refill  . aspirin EC 81 MG tablet  Take 81 mg by mouth daily.    Marland Kitchen atorvastatin (LIPITOR) 80 MG tablet Take 80 mg by mouth daily.    . metFORMIN (GLUCOPHAGE) 500 MG tablet 1,000 mg 2 (two) times daily with breakfast and lunch.  6  . Multiple Vitamin (MULTIVITAMIN WITH MINERALS) TABS Take 1 tablet by mouth daily.    Marland Kitchen PARoxetine (PAXIL) 20 MG tablet Take 20 mg by mouth every morning.    . ramipril (ALTACE) 5 MG tablet Take 5 mg by mouth daily.    Marland Kitchen telmisartan (MICARDIS) 80 MG tablet Take 40 mg by mouth daily.   6   No facility-administered medications prior to visit.     PAST MEDICAL HISTORY: Past Medical History:  Diagnosis Date  . Anxiety   . Chronic kidney disease    hx kidney stones- 2014  . Diabetes mellitus without complication ([place])   . High cholesterol   . Hypertension   . Sleep apnea    wears CPAP    PAST SURGICAL HISTORY: Past Surgical History:  Procedure Laterality Date  . arm surgery     1981  . COLONOSCOPY      FAMILY HISTORY: Family History  Problem Relation Age of Onset  . Colon cancer Mother   . Hypertension Mother   . Hypertension Father   . Nephrolithiasis Father   . Ulcers Father   . Diabetes Mellitus II Father   . Esophageal cancer Neg Hx   . Rectal cancer Neg Hx   . Stomach cancer Neg Hx     SOCIAL HISTORY: Social History   Socioeconomic History  . Marital status: Married    Spouse name: Not on file  . Number of children: Not on file  . Years of education: Not on file  . Highest education level: Not on file  Occupational History    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  Social Needs  . Financial resource strain: Not on file  . Food insecurity:    Worry: Not on file    Inability: Not on file  . Transportation needs:    Medical: Not on file    Non-medical: Not on file  Tobacco Use  . Smoking status: Never Smoker  . Smokeless tobacco: Never Used  Substance and Sexual Activity  . Alcohol use: No  . Drug use: No  . Sexual activity: Not on file  Lifestyle  .  Physical activity:    Days per week: Not on file    Minutes per session: Not on file  . Stress: Not on file  Relationships  . Social connections:    Talks on phone: Not on file    Gets together: Not on file    Attends religious service: Not on file    Active member of club or organization: Not on file    Attends meetings of clubs or organizations: Not on file  Relationship status: Not on file  . Intimate partner violence:    Fear of current or ex partner: Not on file    Emotionally abused: Not on file    Physically abused: Not on file    Forced sexual activity: Not on file  Other Topics Concern  . Not on file  Social History Narrative  . Not on file     PHYSICAL EXAM  Vitals:   01/06/19 0909  BP: (!) 126/58  Pulse: (!) 57  Weight: 193 lb 12.8 oz (87.9 kg)  Height: 6' (1.829 m)   Body mass index is 26.28 kg/m.  Generalized: Well developed, in no acute distress  Head: normocephalic and atraumatic,. Oropharynx benign mallopatti3 Neck: Supple, circumference 17.5 Lungs clear Skin no peripheral edema Musculoskeletal: No deformity   Neurological examination   Mentation: Alert oriented to time, place, history taking. Attention span and concentration appropriate. Recent and remote memory intact.  Follows all commands speech and language fluent.   Cranial nerve II-XII: Pupils were equal round reactive to light extraocular movements were full, visual field were full on confrontational test. Facial sensation and strength were normal. hearing was intact to finger rubbing bilaterally. Uvula tongue midline. head turning and shoulder shrug were normal and symmetric.Tongue protrusion into cheek strength was normal. Motor: normal bulk and tone, full strength in the BUE, BLE, fine finger movements normal, no pronator drift. No focal weakness Sensory: normal and symmetric to light touch,  Coordination: finger-nose-finger, heel-to-shin bilaterally, no dysmetria Gait and Station:  Rising up from seated position without assistance, normal stance,  moderate stride, good arm swing, smooth turning, able to perform tiptoe, and heel walking without difficulty. Tandem gait is steady  DIAGNOSTIC DATA (LABS, IMAGING, TESTING) - I reviewed patient records, labs, notes, testing and imaging myself where available.  Lab Results  Component Value Date   WBC 9.6 04/20/2013   HGB 14.8 04/20/2013   HCT 40.4 04/20/2013   MCV 88.8 04/20/2013   PLT 186 04/20/2013      Component Value Date/Time   NA 141 04/20/2013 1642   K 3.7 04/20/2013 1642   CL 101 04/20/2013 1642   CO2 22 04/20/2013 1642   GLUCOSE 152 (H) 04/20/2013 1642   BUN 20 04/20/2013 1642   CREATININE 0.80 04/20/2013 1642   CALCIUM 10.1 04/20/2013 1642   PROT 7.8 04/20/2013 1642   ALBUMIN 5.2 04/20/2013 1642   AST 26 04/20/2013 1642   ALT 30 04/20/2013 1642   ALKPHOS 82 04/20/2013 1642   BILITOT 1.2 04/20/2013 1642   GFRNONAA >90 04/20/2013 1642   GFRAA >90 04/20/2013 1642    ASSESSMENT AND PLAN  63 y.o. year old male  has a past medical history of Anxiety, Chronic kidney disease, Diabetes mellitus without complication ([place]), High cholesterol, Hypertension, and Sleep apnea here to follow-up for CPAP compliance .Data dated 12/07/2018-01/05/2019 shows compliance greater than 4 hours at 100%.  Average usage 5 hours 59 minutes.  Set pressure 6 to 12 cm.  EPR level 2 AHI 1.7  CPAP compliance 100% reviewed data with patient Continue same settings Follow-up yearly Dennie Bible, [facility], [facility], APRN  [facility] [address], [place], [place] [address] [phone]

## 2019-01-06 ENCOUNTER — Encounter: Payer: Self-pay | Admitting: Nurse Practitioner

## 2019-01-06 ENCOUNTER — Ambulatory Visit (INDEPENDENT_AMBULATORY_CARE_PROVIDER_SITE_OTHER): Payer: 59 | Admitting: Nurse Practitioner

## 2019-01-06 VITALS — BP 126/58 | HR 57 | Ht 72.0 in | Wt 193.8 lb

## 2019-01-06 DIAGNOSIS — Z9989 Dependence on other enabling machines and devices: Secondary | ICD-10-CM | POA: Diagnosis not present

## 2019-01-06 DIAGNOSIS — G4733 Obstructive sleep apnea (adult) (pediatric): Secondary | ICD-10-CM | POA: Diagnosis not present

## 2019-01-06 NOTE — Patient Instructions (Signed)
CPAP compliance 100% Continue same settings Follow-up yearly  

## 2019-01-26 DIAGNOSIS — R82998 Other abnormal findings in urine: Secondary | ICD-10-CM | POA: Diagnosis not present

## 2019-01-26 DIAGNOSIS — E119 Type 2 diabetes mellitus without complications: Secondary | ICD-10-CM | POA: Diagnosis not present

## 2019-01-26 DIAGNOSIS — Z125 Encounter for screening for malignant neoplasm of prostate: Secondary | ICD-10-CM | POA: Diagnosis not present

## 2019-01-26 DIAGNOSIS — Z0000 Encounter for general adult medical examination without abnormal findings: Secondary | ICD-10-CM | POA: Diagnosis not present

## 2019-02-02 DIAGNOSIS — I10 Essential (primary) hypertension: Secondary | ICD-10-CM | POA: Diagnosis not present

## 2019-02-02 DIAGNOSIS — Z23 Encounter for immunization: Secondary | ICD-10-CM | POA: Diagnosis not present

## 2019-02-02 DIAGNOSIS — E1169 Type 2 diabetes mellitus with other specified complication: Secondary | ICD-10-CM | POA: Diagnosis not present

## 2019-02-02 DIAGNOSIS — Z0000 Encounter for general adult medical examination without abnormal findings: Secondary | ICD-10-CM | POA: Diagnosis not present

## 2019-02-02 DIAGNOSIS — E7849 Other hyperlipidemia: Secondary | ICD-10-CM | POA: Diagnosis not present

## 2019-02-06 DIAGNOSIS — G4733 Obstructive sleep apnea (adult) (pediatric): Secondary | ICD-10-CM | POA: Diagnosis not present

## 2019-02-13 DIAGNOSIS — J029 Acute pharyngitis, unspecified: Secondary | ICD-10-CM | POA: Diagnosis not present

## 2020-01-13 DIAGNOSIS — G4733 Obstructive sleep apnea (adult) (pediatric): Secondary | ICD-10-CM | POA: Diagnosis not present

## 2020-03-01 DIAGNOSIS — H5203 Hypermetropia, bilateral: Secondary | ICD-10-CM | POA: Diagnosis not present

## 2020-03-01 DIAGNOSIS — H52203 Unspecified astigmatism, bilateral: Secondary | ICD-10-CM | POA: Diagnosis not present

## 2020-03-01 DIAGNOSIS — E119 Type 2 diabetes mellitus without complications: Secondary | ICD-10-CM | POA: Diagnosis not present

## 2020-03-04 DIAGNOSIS — E7849 Other hyperlipidemia: Secondary | ICD-10-CM | POA: Diagnosis not present

## 2020-03-04 DIAGNOSIS — Z0000 Encounter for general adult medical examination without abnormal findings: Secondary | ICD-10-CM | POA: Diagnosis not present

## 2020-03-04 DIAGNOSIS — E1169 Type 2 diabetes mellitus with other specified complication: Secondary | ICD-10-CM | POA: Diagnosis not present

## 2020-03-04 DIAGNOSIS — Z125 Encounter for screening for malignant neoplasm of prostate: Secondary | ICD-10-CM | POA: Diagnosis not present

## 2020-03-08 DIAGNOSIS — I517 Cardiomegaly: Secondary | ICD-10-CM | POA: Diagnosis not present

## 2020-03-08 DIAGNOSIS — Z1331 Encounter for screening for depression: Secondary | ICD-10-CM | POA: Diagnosis not present

## 2020-03-08 DIAGNOSIS — I10 Essential (primary) hypertension: Secondary | ICD-10-CM | POA: Diagnosis not present

## 2020-03-08 DIAGNOSIS — E785 Hyperlipidemia, unspecified: Secondary | ICD-10-CM | POA: Diagnosis not present

## 2020-03-08 DIAGNOSIS — Z0000 Encounter for general adult medical examination without abnormal findings: Secondary | ICD-10-CM | POA: Diagnosis not present

## 2020-03-08 DIAGNOSIS — E1169 Type 2 diabetes mellitus with other specified complication: Secondary | ICD-10-CM | POA: Diagnosis not present

## 2020-07-11 DIAGNOSIS — R972 Elevated prostate specific antigen [PSA]: Secondary | ICD-10-CM | POA: Diagnosis not present

## 2020-07-11 DIAGNOSIS — E1169 Type 2 diabetes mellitus with other specified complication: Secondary | ICD-10-CM | POA: Diagnosis not present

## 2020-08-24 DIAGNOSIS — J209 Acute bronchitis, unspecified: Secondary | ICD-10-CM | POA: Diagnosis not present

## 2020-08-24 DIAGNOSIS — R05 Cough: Secondary | ICD-10-CM | POA: Diagnosis not present

## 2020-08-24 DIAGNOSIS — Z1152 Encounter for screening for COVID-19: Secondary | ICD-10-CM | POA: Diagnosis not present

## 2021-03-22 DIAGNOSIS — E1169 Type 2 diabetes mellitus with other specified complication: Secondary | ICD-10-CM | POA: Diagnosis not present

## 2021-03-22 DIAGNOSIS — Z125 Encounter for screening for malignant neoplasm of prostate: Secondary | ICD-10-CM | POA: Diagnosis not present

## 2021-03-22 DIAGNOSIS — E785 Hyperlipidemia, unspecified: Secondary | ICD-10-CM | POA: Diagnosis not present

## 2021-03-27 DIAGNOSIS — J069 Acute upper respiratory infection, unspecified: Secondary | ICD-10-CM | POA: Diagnosis not present

## 2021-03-27 DIAGNOSIS — E785 Hyperlipidemia, unspecified: Secondary | ICD-10-CM | POA: Diagnosis not present

## 2021-03-27 DIAGNOSIS — E1169 Type 2 diabetes mellitus with other specified complication: Secondary | ICD-10-CM | POA: Diagnosis not present

## 2021-03-27 DIAGNOSIS — Z1152 Encounter for screening for COVID-19: Secondary | ICD-10-CM | POA: Diagnosis not present

## 2021-03-27 DIAGNOSIS — G4730 Sleep apnea, unspecified: Secondary | ICD-10-CM | POA: Diagnosis not present

## 2021-03-27 DIAGNOSIS — Z0000 Encounter for general adult medical examination without abnormal findings: Secondary | ICD-10-CM | POA: Diagnosis not present

## 2021-03-27 DIAGNOSIS — I10 Essential (primary) hypertension: Secondary | ICD-10-CM | POA: Diagnosis not present

## 2021-03-27 DIAGNOSIS — R972 Elevated prostate specific antigen [PSA]: Secondary | ICD-10-CM | POA: Diagnosis not present

## 2021-03-27 DIAGNOSIS — I340 Nonrheumatic mitral (valve) insufficiency: Secondary | ICD-10-CM | POA: Diagnosis not present

## 2021-03-27 DIAGNOSIS — R82998 Other abnormal findings in urine: Secondary | ICD-10-CM | POA: Diagnosis not present

## 2021-03-27 DIAGNOSIS — R69 Illness, unspecified: Secondary | ICD-10-CM | POA: Diagnosis not present

## 2021-03-28 ENCOUNTER — Other Ambulatory Visit (HOSPITAL_COMMUNITY): Payer: Self-pay | Admitting: Internal Medicine

## 2021-03-28 DIAGNOSIS — I340 Nonrheumatic mitral (valve) insufficiency: Secondary | ICD-10-CM

## 2021-04-25 ENCOUNTER — Other Ambulatory Visit: Payer: Self-pay

## 2021-04-25 ENCOUNTER — Ambulatory Visit (HOSPITAL_COMMUNITY): Payer: Medicare HMO | Attending: Internal Medicine

## 2021-04-25 DIAGNOSIS — I340 Nonrheumatic mitral (valve) insufficiency: Secondary | ICD-10-CM | POA: Insufficient documentation

## 2021-04-25 LAB — ECHOCARDIOGRAM COMPLETE
Area-P 1/2: 2.99 cm2
MV M vel: 3.89 m/s
MV Peak grad: 60.5 mmHg
Radius: 0.4 cm
S' Lateral: 2.8 cm

## 2021-08-01 DIAGNOSIS — I340 Nonrheumatic mitral (valve) insufficiency: Secondary | ICD-10-CM | POA: Diagnosis not present

## 2021-08-01 DIAGNOSIS — E1169 Type 2 diabetes mellitus with other specified complication: Secondary | ICD-10-CM | POA: Diagnosis not present

## 2021-08-01 DIAGNOSIS — R69 Illness, unspecified: Secondary | ICD-10-CM | POA: Diagnosis not present

## 2021-08-01 DIAGNOSIS — G4730 Sleep apnea, unspecified: Secondary | ICD-10-CM | POA: Diagnosis not present

## 2021-08-01 DIAGNOSIS — E785 Hyperlipidemia, unspecified: Secondary | ICD-10-CM | POA: Diagnosis not present

## 2021-08-01 DIAGNOSIS — I10 Essential (primary) hypertension: Secondary | ICD-10-CM | POA: Diagnosis not present

## 2021-08-01 DIAGNOSIS — R972 Elevated prostate specific antigen [PSA]: Secondary | ICD-10-CM | POA: Diagnosis not present

## 2021-08-29 DIAGNOSIS — C61 Malignant neoplasm of prostate: Secondary | ICD-10-CM | POA: Diagnosis not present

## 2021-08-29 DIAGNOSIS — N486 Induration penis plastica: Secondary | ICD-10-CM | POA: Diagnosis not present

## 2021-08-29 DIAGNOSIS — N5201 Erectile dysfunction due to arterial insufficiency: Secondary | ICD-10-CM | POA: Diagnosis not present

## 2021-09-05 ENCOUNTER — Other Ambulatory Visit: Payer: Self-pay

## 2021-09-05 ENCOUNTER — Emergency Department (HOSPITAL_COMMUNITY)
Admission: EM | Admit: 2021-09-05 | Discharge: 2021-09-05 | Disposition: A | Discharge status: Home / Self Care | Payer: Medicare HMO | Attending: Emergency Medicine | Admitting: Emergency Medicine

## 2021-09-05 ENCOUNTER — Emergency Department (HOSPITAL_COMMUNITY): Payer: Medicare HMO

## 2021-09-05 ENCOUNTER — Encounter (HOSPITAL_COMMUNITY): Payer: Self-pay | Admitting: Emergency Medicine

## 2021-09-05 DIAGNOSIS — Z79899 Other long term (current) drug therapy: Secondary | ICD-10-CM | POA: Diagnosis not present

## 2021-09-05 DIAGNOSIS — R1032 Left lower quadrant pain: Secondary | ICD-10-CM | POA: Diagnosis not present

## 2021-09-05 DIAGNOSIS — N211 Calculus in urethra: Secondary | ICD-10-CM | POA: Diagnosis not present

## 2021-09-05 DIAGNOSIS — E1122 Type 2 diabetes mellitus with diabetic chronic kidney disease: Secondary | ICD-10-CM | POA: Insufficient documentation

## 2021-09-05 DIAGNOSIS — Q433 Congenital malformations of intestinal fixation: Secondary | ICD-10-CM | POA: Diagnosis not present

## 2021-09-05 DIAGNOSIS — Z7984 Long term (current) use of oral hypoglycemic drugs: Secondary | ICD-10-CM | POA: Diagnosis not present

## 2021-09-05 DIAGNOSIS — N2881 Hypertrophy of kidney: Secondary | ICD-10-CM | POA: Diagnosis not present

## 2021-09-05 DIAGNOSIS — N200 Calculus of kidney: Secondary | ICD-10-CM | POA: Diagnosis not present

## 2021-09-05 DIAGNOSIS — Z7982 Long term (current) use of aspirin: Secondary | ICD-10-CM | POA: Diagnosis not present

## 2021-09-05 DIAGNOSIS — N189 Chronic kidney disease, unspecified: Secondary | ICD-10-CM | POA: Insufficient documentation

## 2021-09-05 DIAGNOSIS — N201 Calculus of ureter: Secondary | ICD-10-CM | POA: Diagnosis not present

## 2021-09-05 DIAGNOSIS — I129 Hypertensive chronic kidney disease with stage 1 through stage 4 chronic kidney disease, or unspecified chronic kidney disease: Secondary | ICD-10-CM | POA: Diagnosis not present

## 2021-09-05 DIAGNOSIS — N139 Obstructive and reflux uropathy, unspecified: Secondary | ICD-10-CM | POA: Diagnosis not present

## 2021-09-05 LAB — URINALYSIS, ROUTINE W REFLEX MICROSCOPIC
Bilirubin Urine: NEGATIVE
Glucose, UA: 500 mg/dL — AB
Leukocytes,Ua: NEGATIVE
Nitrite: NEGATIVE
Protein, ur: NEGATIVE mg/dL
RBC / HPF: >50 RBC/hpf — ABNORMAL HIGH (ref 0–5)
Specific Gravity, Urine: 1.025 (ref 1.005–1.030)
pH: 6 (ref 5.0–8.0)

## 2021-09-05 LAB — COMPREHENSIVE METABOLIC PANEL
ALT: 17 U/L (ref 0–44)
AST: 19 U/L (ref 15–41)
Albumin: 4.6 g/dL (ref 3.5–5.0)
Alkaline Phosphatase: 59 U/L (ref 38–126)
Anion gap: 13 (ref 5–15)
BUN: 26 mg/dL — ABNORMAL HIGH (ref 8–23)
CO2: 25 mmol/L (ref 22–32)
Calcium: 9.6 mg/dL (ref 8.9–10.3)
Chloride: 100 mmol/L (ref 98–111)
Creatinine, Ser: 0.8 mg/dL (ref 0.61–1.24)
GFR, Estimated: >60 mL/min (ref >60)
Glucose, Bld: 175 mg/dL — ABNORMAL HIGH (ref 70–99)
Potassium: 3.6 mmol/L (ref 3.5–5.1)
Sodium: 138 mmol/L (ref 135–145)
Total Bilirubin: 1.2 mg/dL (ref 0.3–1.2)
Total Protein: 7.2 g/dL (ref 6.5–8.1)

## 2021-09-05 LAB — CBC WITH DIFFERENTIAL/PLATELET
Abs Immature Granulocytes: 0.02 10*3/uL (ref 0.00–0.07)
Basophils Absolute: 0 10*3/uL (ref 0.0–0.1)
Basophils Relative: 0 %
Eosinophils Absolute: 0.1 10*3/uL (ref 0.0–0.5)
Eosinophils Relative: 1 %
HCT: 37.3 % — ABNORMAL LOW (ref 39.0–52.0)
Hemoglobin: 12.5 g/dL — ABNORMAL LOW (ref 13.0–17.0)
Immature Granulocytes: 0 %
Lymphocytes Relative: 22 %
Lymphs Abs: 1.1 10*3/uL (ref 0.7–4.0)
MCH: 31.7 pg (ref 26.0–34.0)
MCHC: 33.5 g/dL (ref 30.0–36.0)
MCV: 94.7 fL (ref 80.0–100.0)
Monocytes Absolute: 0.4 10*3/uL (ref 0.1–1.0)
Monocytes Relative: 8 %
Neutro Abs: 3.4 10*3/uL (ref 1.7–7.7)
Neutrophils Relative %: 69 %
Platelets: 177 10*3/uL (ref 150–400)
RBC: 3.94 MIL/uL — ABNORMAL LOW (ref 4.22–5.81)
RDW: 11.7 % (ref 11.5–15.5)
WBC: 5 10*3/uL (ref 4.0–10.5)
nRBC: 0 % (ref 0.0–0.2)

## 2021-09-05 LAB — LIPASE, BLOOD: Lipase: 50 U/L (ref 11–51)

## 2021-09-05 MED ORDER — KETOROLAC TROMETHAMINE 30 MG/ML IJ SOLN
30.0000 mg | Freq: Once | INTRAMUSCULAR | Status: AC
  Administered 2021-09-05: 30 mg via INTRAMUSCULAR
  Filled 2021-09-05: qty 1

## 2021-09-05 NOTE — ED Notes (Signed)
Patient states he has not had much pain over the last few hours. States he may have passed the stone. Rates his pain 1/10 on the left side.

## 2021-09-05 NOTE — ED Provider Notes (Signed)
Emergency Medicine Provider Triage Evaluation Note  ETHELBERT THAIN , a 65 y.o. male  was evaluated in triage.  Pt complains of left flank pain that started 2 hours ago. Denies nv, feels like prior kidney stone.  Review of Systems  Positive: Flank pain Negative: nv  Physical Exam  BP (!) 158/84   Pulse (!) 57   Temp 97.8 F (36.6 C) (Oral)   Resp 15   Ht 6' (1.829 m)   Wt 85.7 kg   SpO2 96%   BMI 25.63 kg/m  Gen:   Awake, no distress   Resp:  Normal effort  MSK:   Moves extremities without difficulty   Medical Decision Making  Medically screening exam initiated at 5:31 PM.  Appropriate orders placed.  TOMER CHALMERS was informed that the remainder of the evaluation will be completed by another provider, this initial triage assessment does not replace that evaluation, and the importance of remaining in the ED until their evaluation is complete.     Bishop Dublin 09/05/21 1731    Blanchie Dessert, MD 09/06/21 [phone]

## 2021-09-05 NOTE — ED Triage Notes (Signed)
Pt c/o intermittent left sided flank pain that began 2 hours ago. Denies Urinary sx, fevers, N/V. Hx of kidney stones. Pt states it feels like his previous kidney stone.

## 2021-09-05 NOTE — ED Provider Notes (Signed)
[facility] DEPT Provider Note   CSN: [id number] Arrival date & time: 09/05/21  1654     History Chief Complaint  Patient presents with   Flank Pain    Ronnie Brewer is a 65 y.o. male.   Flank Pain Pertinent negatives include no chest pain, no abdominal pain and no shortness of breath.  65 year old male with a history of kidney stones who presents to the emergency department with concern for nephrolithiasis.  The patient states that earlier today he developed sudden onset left lower quadrant abdominal pain.  The pain feels similar to prior kidney stones.  The pain is described as sharp and severe, located in his left lower quadrant, radiating to his left flank.    Past Medical History:  Diagnosis Date   Anxiety    Chronic kidney disease    hx kidney stones- 2014   Diabetes mellitus without complication (H[facility])    High cholesterol    Hypertension    Sleep apnea    wears CPAP    Patient Active Problem List   Diagnosis Date Noted   Obstructive sleep apnea treated with continuous positive airway pressure (CPAP) 06/25/2018   Treatment-emergent central sleep apnea 03/14/2018   Type 2 diabetes mellitus with hyperglycemia, without long-term current use of insulin ([place]) 03/14/2018   Hypertensive left ventricular hypertrophy, without heart failure 03/14/2018    Past Surgical History:  Procedure Laterality Date   arm surgery     1981   COLONOSCOPY         Family History  Problem Relation Age of Onset   Colon cancer Mother    Hypertension Mother    Hypertension Father    Nephrolithiasis Father    Ulcers Father    Diabetes Mellitus II Father    Esophageal cancer Neg Hx    Rectal cancer Neg Hx    Stomach cancer Neg Hx     Social History   Tobacco Use   Smoking status: Never   Smokeless tobacco: Never  Substance Use Topics   Alcohol use: No   Drug use: No    Home Medications Prior to Admission medications   Medication Sig  Start Date End Date Taking? Authorizing Provider  aspirin EC 81 MG tablet Take 81 mg by mouth daily.    [provider]  atorvastatin (LIPITOR) 80 MG tablet Take 80 mg by mouth daily.    [provider]  metFORMIN (GLUCOPHAGE) 500 MG tablet 1,000 mg 2 (two) times daily with breakfast and lunch. 02/05/18   [provider]  Multiple Vitamin (MULTIVITAMIN WITH MINERALS) TABS Take 1 tablet by mouth daily.    [provider]  PARoxetine (PAXIL) 20 MG tablet Take 20 mg by mouth every morning.    [provider]  ramipril (ALTACE) 5 MG tablet Take 5 mg by mouth daily.    [provider]  telmisartan (MICARDIS) 80 MG tablet Take 40 mg by mouth daily.  02/05/18   [provider]    Allergies    Codeine  Review of Systems   Review of Systems  Constitutional:  Negative for chills and fever.  HENT:  Negative for ear pain and sore throat.   Eyes:  Negative for pain and visual disturbance.  Respiratory:  Negative for cough and shortness of breath.   Cardiovascular:  Negative for chest pain and palpitations.  Gastrointestinal:  Negative for abdominal pain and vomiting.  Genitourinary:  Positive for flank pain. Negative for dysuria and hematuria.  Musculoskeletal:  Negative for arthralgias and back pain.  Skin:  Negative for color change and rash.  Neurological:  Negative for seizures and syncope.  All other systems reviewed and are negative.  Physical Exam Updated Vital Signs BP 136/64   Pulse (!) 55   Temp 97.8 F (36.6 C) (Oral)   Resp 17   Ht 6' (1.829 m)   Wt 85.7 kg   SpO2 100%   BMI 25.63 kg/m   Physical Exam Vitals and nursing note reviewed.  Constitutional:      Appearance: He is well-developed.  HENT:     Head: Normocephalic and atraumatic.  Eyes:     Conjunctiva/sclera: Conjunctivae normal.  Cardiovascular:     Rate and Rhythm: Normal rate and regular rhythm.     Heart sounds: No murmur heard. Pulmonary:      Effort: Pulmonary effort is normal. No respiratory distress.     Breath sounds: Normal breath sounds.  Abdominal:     Palpations: Abdomen is soft.     Tenderness: There is no abdominal tenderness. There is no right CVA tenderness, left CVA tenderness, guarding or rebound.  Musculoskeletal:     Cervical back: Neck supple.  Skin:    General: Skin is warm and dry.  Neurological:     Mental Status: He is alert.    ED Results / Procedures / Treatments   Labs (all labs ordered are listed, but only abnormal results are displayed) Labs Reviewed  CBC WITH DIFFERENTIAL/PLATELET - Abnormal; Notable for the following components:      Result Value   RBC 3.94 (*)    Hemoglobin 12.5 (*)    HCT 37.3 (*)    All other components within normal limits  COMPREHENSIVE METABOLIC PANEL - Abnormal; Notable for the following components:   Glucose, Bld 175 (*)    BUN 26 (*)    All other components within normal limits  URINALYSIS, ROUTINE W REFLEX MICROSCOPIC - Abnormal; Notable for the following components:   APPearance CLOUDY (*)    Glucose, UA 500 (*)    Hgb urine dipstick LARGE (*)    Ketones, ur TRACE (*)    RBC / HPF >50 (*)    Bacteria, UA RARE (*)    All other components within normal limits  LIPASE, BLOOD    EKG None  Radiology CT Renal Stone Study  Result Date: 09/05/2021 CLINICAL DATA:  Left flank pain for 2 hours EXAM: CT ABDOMEN AND PELVIS WITHOUT CONTRAST TECHNIQUE: Multidetector CT imaging of the abdomen and pelvis was performed following the standard protocol without IV contrast. Unenhanced CT was performed per clinician order. Lack of IV contrast limits sensitivity and specificity, especially for evaluation of abdominal/pelvic solid viscera. COMPARISON:  04/20/2013 FINDINGS: Lower chest: No acute pleural or parenchymal lung disease. Hepatobiliary: No focal liver abnormality is seen. No gallstones, gallbladder wall thickening, or biliary dilatation. Pancreas: Unremarkable. No  pancreatic ductal dilatation or surrounding inflammatory changes. Spleen: Normal in size without focal abnormality. Adrenals/Urinary Tract: There is mild left-sided obstructive uropathy secondary to a 2 mm obstructing left UVJ calculus reference image 77/2. The left kidney is enlarged and edematous. No significant perinephric fat stranding. The right kidney is unremarkable without nephrolithiasis or obstructive uropathy. The adrenals and bladder are unremarkable. 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 The cecum is within the midline pelvis. A dilated appendix can be seen within the right lower quadrant reference  images 64 through 71 of series 2. The appendix measures up to 10 mm in diameter. No evidence of periappendiceal fat stranding or appendicolith to suggest acute appendicitis, however. Correlation with physical exam findings and laboratory values recommended. Vascular/Lymphatic: Aortic atherosclerosis. No enlarged abdominal or pelvic lymph nodes. Reproductive: Prostate is unremarkable. Other: No free fluid or free gas.  No abdominal wall hernia. Musculoskeletal: No acute or destructive bony lesions. Reconstructed images demonstrate no additional findings. IMPRESSION: 1. Mild left-sided obstructive uropathy due to a 2 mm obstructing left UVJ calculus. 2. Dilated appendix in the right lower quadrant measuring 10 mm. There is no wall thickening, periappendiceal fat stranding, or appendicolith to suggest acute appendicitis however. Appearance could suggest underlying mucocele. 3.  Aortic Atherosclerosis (ICD10-I70.0). Electronically Signed   By: Randa Ngo M.D.   On: 09/05/2021 18:31    Procedures Procedures   Medications Ordered in ED Medications  ketorolac (TORADOL) 30 MG/ML injection 30 mg (30 mg Intramuscular Given 09/05/21 2139)    ED Course  I have reviewed the triage vital signs and the nursing  notes.  Pertinent labs & imaging results that were available during my care of the patient were reviewed by me and considered in my medical decision making (see chart for details).  Clinical Course as of 09/06/21 1426  Tue Sep 05, 2021  2101 CT Renal Laren Everts [JL]    Clinical Course User Index [JL] Regan Lemming, MD   MDM Rules/Calculators/A&P                           65 year old male with a history of kidney stones who presents to the emergency department with concern for nephrolithiasis.  The patient states that earlier today he developed sudden onset left lower quadrant abdominal pain.  The pain feels similar to prior kidney stones.  The pain is described as sharp and severe, located in his left lower quadrant, radiating to his left flank.  The patient's urinalysis was positive for hematuria.  His CT stone study was positive for a left 2 mm stone at the UVJ with mild hydronephrosis.  No evidence of superimposed infection with no evidence for sepsis.  The patient states that his pain is resolved.  He thinks he may have passed the stone while waiting in the waiting room.  He was administered Toradol for pain control.  He remained well-appearing and without pain on reassessment.  Advised the patient that the stone has either likely passed or will pass on its own given its size.  No evidence for acute intervention at this time.  Advised NSAIDs and outpatient urology follow-up.   Final Clinical Impression(s) / ED Diagnoses Final diagnoses:  Ureterolithiasis    Rx / DC Orders ED Discharge Orders          Ordered    Ambulatory referral to Urology        09/05/21 2137             Regan Lemming, MD 09/06/21 1426

## 2021-09-07 DIAGNOSIS — N201 Calculus of ureter: Secondary | ICD-10-CM | POA: Diagnosis not present

## 2021-12-22 DIAGNOSIS — G4730 Sleep apnea, unspecified: Secondary | ICD-10-CM | POA: Diagnosis not present

## 2021-12-22 DIAGNOSIS — R972 Elevated prostate specific antigen [PSA]: Secondary | ICD-10-CM | POA: Diagnosis not present

## 2021-12-22 DIAGNOSIS — N200 Calculus of kidney: Secondary | ICD-10-CM | POA: Diagnosis not present

## 2021-12-22 DIAGNOSIS — R69 Illness, unspecified: Secondary | ICD-10-CM | POA: Diagnosis not present

## 2021-12-22 DIAGNOSIS — E785 Hyperlipidemia, unspecified: Secondary | ICD-10-CM | POA: Diagnosis not present

## 2021-12-22 DIAGNOSIS — E1169 Type 2 diabetes mellitus with other specified complication: Secondary | ICD-10-CM | POA: Diagnosis not present

## 2021-12-22 DIAGNOSIS — I10 Essential (primary) hypertension: Secondary | ICD-10-CM | POA: Diagnosis not present

## 2021-12-22 DIAGNOSIS — I340 Nonrheumatic mitral (valve) insufficiency: Secondary | ICD-10-CM | POA: Diagnosis not present

## 2022-01-19 DIAGNOSIS — G4733 Obstructive sleep apnea (adult) (pediatric): Secondary | ICD-10-CM | POA: Diagnosis not present

## 2022-02-16 DIAGNOSIS — G4733 Obstructive sleep apnea (adult) (pediatric): Secondary | ICD-10-CM | POA: Diagnosis not present

## 2022-02-19 ENCOUNTER — Other Ambulatory Visit: Payer: Self-pay | Admitting: Urology

## 2022-02-19 DIAGNOSIS — C61 Malignant neoplasm of prostate: Secondary | ICD-10-CM

## 2022-02-20 DIAGNOSIS — C61 Malignant neoplasm of prostate: Secondary | ICD-10-CM | POA: Diagnosis not present

## 2022-03-05 ENCOUNTER — Other Ambulatory Visit: Payer: Self-pay

## 2022-03-05 ENCOUNTER — Ambulatory Visit
Admission: RE | Admit: 2022-03-05 | Discharge: 2022-03-05 | Disposition: A | Discharge status: Home / Self Care | Payer: Medicare HMO | Source: Ambulatory Visit | Attending: Urology | Admitting: Urology

## 2022-03-05 DIAGNOSIS — R972 Elevated prostate specific antigen [PSA]: Secondary | ICD-10-CM | POA: Diagnosis not present

## 2022-03-05 DIAGNOSIS — C61 Malignant neoplasm of prostate: Secondary | ICD-10-CM

## 2022-03-05 MED ORDER — GADOBENATE DIMEGLUMINE 529 MG/ML IV SOLN
17.0000 mL | Freq: Once | INTRAVENOUS | Status: AC | PRN
  Administered 2022-03-05: 17 mL via INTRAVENOUS

## 2022-03-13 DIAGNOSIS — N486 Induration penis plastica: Secondary | ICD-10-CM | POA: Diagnosis not present

## 2022-03-13 DIAGNOSIS — C61 Malignant neoplasm of prostate: Secondary | ICD-10-CM | POA: Diagnosis not present

## 2022-03-13 DIAGNOSIS — N5201 Erectile dysfunction due to arterial insufficiency: Secondary | ICD-10-CM | POA: Diagnosis not present

## 2022-03-19 DIAGNOSIS — G4733 Obstructive sleep apnea (adult) (pediatric): Secondary | ICD-10-CM | POA: Diagnosis not present

## 2022-03-22 ENCOUNTER — Encounter: Payer: Self-pay | Admitting: Neurology

## 2022-03-22 ENCOUNTER — Ambulatory Visit: Payer: Medicare HMO | Admitting: Neurology

## 2022-03-22 VITALS — BP 124/66 | HR 62 | Ht 72.0 in | Wt 188.2 lb

## 2022-03-22 DIAGNOSIS — Z9989 Dependence on other enabling machines and devices: Secondary | ICD-10-CM | POA: Diagnosis not present

## 2022-03-22 DIAGNOSIS — E1165 Type 2 diabetes mellitus with hyperglycemia: Secondary | ICD-10-CM

## 2022-03-22 DIAGNOSIS — I119 Hypertensive heart disease without heart failure: Secondary | ICD-10-CM | POA: Diagnosis not present

## 2022-03-22 DIAGNOSIS — G4733 Obstructive sleep apnea (adult) (pediatric): Secondary | ICD-10-CM

## 2022-03-22 NOTE — Progress Notes (Signed)
?SLEEP MEDICINE CLINIC ? ? ?Provider:  Larey Seat, M D  ?Primary Care Physician:  Prince Solian, MD ? ? ?Referring Provider: Prince Solian, MD ? ? ?Chief Complaint  ?Patient presents with  ? Follow-up  ?  RM 11, alone. Last seen 01/06/2019. CPAP f/u. DME: [facility].   ? ? ?HPI:  Ronnie Brewer is a now  66 y.o. male , seen here upon re- referral from Dr. Dagmar Hait for an evaluation of sleep apnea.  The patient will be treated as a new patient today due to the 3-year hiatus.  He carries a diagnosis of complex sleep apnea and was treated with CPAP, the last visit was met with nurse practitioner Gilford Raid. ?[name] is a highly compliant CPAP user his machine was set up on 05/03/2018 and he is now heading towards its fifth year on CPAP at least on this machine.  With him 100% compliance includes 30 out of 30-day use and each day over 4 hours of consecutive use.  On average his CPAP therapy is 6 hours hours and 27 minutes at night.  He is using an AutoSet 10 by [employer] with a setting between 6 and 12 cm water pressure and 2 cm EPR, the AHI is 1.4/h which speaks for an excellent resolution.  Pressure at the 95th percentile is 10.8 cm water leaks are seen at the 95th percentile of 24 L/min.  He does consider himself CPAP dependent and does not sleep well without.  1 he uses a nasal interface but does not cover his mouth. ? ?His medication list is almost unchanged.  He is taking a lower dose of metformin than when I last met him, he is on Lipitor, takes a baby aspirin a day multivitamins Paxil at 20 mg in the morning telmisartan. ? ?No hospitalizations in the interval time since last visit. He contracted Covid February-March of this year. He has laryngitis, bronchitis.  ?He has had Nocturia, polydipsia, Diabetes type 2, had kidney stones. Pneumonia in 2016, Mitral regurgitation, LVH, normal EF %. Now he has 1 time nocturia on CPAP and lost some weight.  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 N1 cup in AM  caffeine drinker, non tobacco user, alcohol: none.   ? ?His sleep habits are remaining healthy and he sleeps well.  Dinner time is about 5-6 PM. ?bedtime is around 11-12 midnight, without problems to initiate sleep. Uses 1 pillow, flat bed, and he reports frequent dreams. ?Bedroom is quiet and dark and cool. ?Gets up at 7.30 AM , spontaneous. Refreshed and restored.  ? ? ? ? ?Original consult : 02-10-2018  ?He developed excessive daytime sleepiness many years ago. He has had 2 sleep studies : one  25 years ago at the hospital, and about 12 years ago another one. He now is in a position where he cannot get supplies for his now over 96 year old CPAP machine. Dr. Dagmar Hait had repeated an ONO with him before the second study, and felt that CPAP corrected his apnea for all these years sufficiently. His wife, [name], has been our patient and he chose to come here for this work up.  ? ?I have access to a compliance report, which shows that the patient is 100% compliant user including the last day of 3 March of this year.  He has used the machine 29 out of 30 days over 4 hours consecutively, average use of time is 6 hours and 12  minutes, set pressure is 10 cmH2O was 2 cm EPR, residual AHI is ideal 2.7 only.  He does have high air leaks currently and this may be related to the age of his mask.  I also noted that he has more residual central apneas and obstructive apneas this would indicate that he may have too high a pressure set.  ? What we are going to do is to test him again for his current level of apnea and then either to up titrate or invite him for a CPAP titration depending on his insurance. ? ?He had recent labs and I reviewed these and his med list. ? ? ? ? ?Chief complaint according to patient : I need supplies ? ?Sleep habits are as follows: The patient's usual bedtime is around midnight and he does not have trouble to fall asleep.  He usually watches TV before he retreats to the  bedroom, which is cool, quiet and dark and he shares it with his wife. ?She has noted at times that Mr. Chaikin is snoring or at least audibly breathing in spite of using the CPAP compliantly.  He has no nocturia since Dr. Elsworth Soho started him on metformin, the urinary frequency was a response to glucosuria. He sleeps through the night, wakes spontanously at 7.30 AM, just before his alarm rings. ?Their  3rd eldest daughter ( 32 )  lives with them since she suffered a TBI in a MVA at age 16 . Her sisters all live close by.  ? ? ?Sleep medical history and family sleep history:  Diagnosed at age 22 with OSA, wife has OSA both use CPAP. Allergic rhinitis, hyperchol, hypertension, DM2.  ? 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   ? ?Review of Systems: ?Out of a complete 14 system review, the patient complains of only the following symptoms, and all other reviewed systems are negative. ? ?Snoring on CPAP . ? ?How likely are you to doze in the following situations: ?0 = not likely, 1 = slight chance, 2 = moderate chance, 3 = high chance ? ?Sitting and Reading? ?Watching Television? ?Sitting inactive in a public place (theater or meeting)? ?Lying down in the afternoon when circumstances permit? ?Sitting and talking to someone? ?Sitting quietly after lunch without alcohol? ?In a car, while stopped for a few minutes in traffic? ?As a passenger in a car for an hour without a break? ? ?Total =  ? ?Epworth score  11/ 24 on CPAP  , Fatigue severity score 20/ 63   ,  ?depression score not clinically significant 2/ 15 points.   ? ? ?Social History  ? ?Socioeconomic History  ? Marital status: Married  ?  Spouse name: Not on file  ? Number of children: Not on file  ? Years of education: Not on file  ? Highest education level: Not on file  ?Occupational History  ?  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  ?Tobacco Use  ? Smoking status: Never  ? Smokeless tobacco: Never  ?Substance and Sexual  Activity  ? Alcohol use: No  ? Drug use: No  ? Sexual activity: Not on file  ?Other Topics Concern  ? Not on file  ?Social History Narrative  ? Not on file  ? ?Social Determinants of Health  ? ?Financial Resource Strain: Not on file  ?Food Insecurity: Not on file  ?Transportation Needs: Not on file  ?Physical Activity: Not on file  ?Stress: Not on file  ?Social Connections: Not  on file  ?Intimate Partner Violence: Not on file  ? ? ?Family History  ?Problem Relation Age of Onset  ? Colon cancer Mother   ? Hypertension Mother   ? Hypertension Father   ? Nephrolithiasis Father   ? Ulcers Father   ? Diabetes Mellitus II Father   ? Esophageal cancer Neg Hx   ? Rectal cancer Neg Hx   ? Stomach cancer Neg Hx   ? ? ?Past Medical History:  ?Diagnosis Date  ? Anxiety   ? Chronic kidney disease   ? hx kidney stones- 2014  ? Diabetes mellitus without complication ([facility])   ? High cholesterol   ? Hypertension   ? Sleep apnea   ? wears CPAP  ? ? ?Past Surgical History:  ?Procedure Laterality Date  ? arm surgery    ? 1981  ? COLONOSCOPY    ? ? ?Current Outpatient Medications  ?Medication Sig Dispense Refill  ? aspirin EC 81 MG tablet Take 81 mg by mouth daily.    ? atorvastatin (LIPITOR) 80 MG tablet Take 80 mg by mouth daily.    ? metFORMIN (GLUCOPHAGE) 500 MG tablet Take 500 mg by mouth in the morning, at noon, and at bedtime.    ? Multiple Vitamin (MULTIVITAMIN WITH MINERALS) TABS Take 1 tablet by mouth daily.    ? PARoxetine (PAXIL) 20 MG tablet Take 20 mg by mouth every morning.    ? telmisartan (MICARDIS) 80 MG tablet Take 40 mg by mouth daily.   6  ? ?No current facility-administered medications for this visit.  ? ? ?Allergies as of 03/22/2022 - Review Complete 03/22/2022  ?Allergen Reaction Noted  ? Codeine Nausea And Vomiting 04/20/2013  ? ? ?Vitals: ?BP 124/66 (BP Location: Left Arm, Patient Position: Sitting, Cuff Size: Normal)   Pulse 62   Ht 6' (1.829 m)   Wt 188 lb 3.2 oz (85.4 kg)   SpO2 97%   BMI 25.52 kg/m?   ?Last Weight:  ?Wt Readings from Last 1 Encounters:  ?03/22/22 188 lb 3.2 oz (85.4 kg)  ? LNZ:VJKQ mass index is 25.52 kg/m?Marland Kitchen     Last Height:   ?Ht Readings from Last 1 Encounters:  ?03/22/22 6' (1.829 m)  ? ? ?Phys

## 2022-03-22 NOTE — Patient Instructions (Signed)

## 2022-04-11 DIAGNOSIS — E1169 Type 2 diabetes mellitus with other specified complication: Secondary | ICD-10-CM | POA: Diagnosis not present

## 2022-04-11 DIAGNOSIS — I10 Essential (primary) hypertension: Secondary | ICD-10-CM | POA: Diagnosis not present

## 2022-04-11 DIAGNOSIS — Z125 Encounter for screening for malignant neoplasm of prostate: Secondary | ICD-10-CM | POA: Diagnosis not present

## 2022-04-17 ENCOUNTER — Other Ambulatory Visit (HOSPITAL_COMMUNITY): Payer: Self-pay | Admitting: Internal Medicine

## 2022-04-17 DIAGNOSIS — I10 Essential (primary) hypertension: Secondary | ICD-10-CM | POA: Diagnosis not present

## 2022-04-17 DIAGNOSIS — E1169 Type 2 diabetes mellitus with other specified complication: Secondary | ICD-10-CM | POA: Diagnosis not present

## 2022-04-17 DIAGNOSIS — Z23 Encounter for immunization: Secondary | ICD-10-CM | POA: Diagnosis not present

## 2022-04-17 DIAGNOSIS — Z800 Family history of malignant neoplasm of digestive organs: Secondary | ICD-10-CM | POA: Diagnosis not present

## 2022-04-17 DIAGNOSIS — Z0000 Encounter for general adult medical examination without abnormal findings: Secondary | ICD-10-CM | POA: Diagnosis not present

## 2022-04-17 DIAGNOSIS — R82998 Other abnormal findings in urine: Secondary | ICD-10-CM | POA: Diagnosis not present

## 2022-04-17 DIAGNOSIS — F5221 Male erectile disorder: Secondary | ICD-10-CM | POA: Diagnosis not present

## 2022-04-17 DIAGNOSIS — Z1339 Encounter for screening examination for other mental health and behavioral disorders: Secondary | ICD-10-CM | POA: Diagnosis not present

## 2022-04-17 DIAGNOSIS — N200 Calculus of kidney: Secondary | ICD-10-CM | POA: Diagnosis not present

## 2022-04-17 DIAGNOSIS — E785 Hyperlipidemia, unspecified: Secondary | ICD-10-CM | POA: Diagnosis not present

## 2022-04-17 DIAGNOSIS — R69 Illness, unspecified: Secondary | ICD-10-CM | POA: Diagnosis not present

## 2022-04-17 DIAGNOSIS — C61 Malignant neoplasm of prostate: Secondary | ICD-10-CM | POA: Diagnosis not present

## 2022-04-17 DIAGNOSIS — I340 Nonrheumatic mitral (valve) insufficiency: Secondary | ICD-10-CM | POA: Diagnosis not present

## 2022-04-17 DIAGNOSIS — Z1331 Encounter for screening for depression: Secondary | ICD-10-CM | POA: Diagnosis not present

## 2022-04-19 ENCOUNTER — Ambulatory Visit (HOSPITAL_COMMUNITY): Payer: Medicare HMO | Attending: Cardiology

## 2022-04-19 DIAGNOSIS — I340 Nonrheumatic mitral (valve) insufficiency: Secondary | ICD-10-CM | POA: Diagnosis not present

## 2022-04-19 LAB — ECHOCARDIOGRAM COMPLETE
Area-P 1/2: 3.85 cm2
S' Lateral: 3.3 cm

## 2022-07-25 DIAGNOSIS — E119 Type 2 diabetes mellitus without complications: Secondary | ICD-10-CM | POA: Diagnosis not present

## 2022-07-25 DIAGNOSIS — H5203 Hypermetropia, bilateral: Secondary | ICD-10-CM | POA: Diagnosis not present

## 2022-07-25 DIAGNOSIS — H524 Presbyopia: Secondary | ICD-10-CM | POA: Diagnosis not present

## 2022-08-24 DIAGNOSIS — C61 Malignant neoplasm of prostate: Secondary | ICD-10-CM | POA: Diagnosis not present

## 2022-08-24 DIAGNOSIS — Z800 Family history of malignant neoplasm of digestive organs: Secondary | ICD-10-CM | POA: Diagnosis not present

## 2022-08-24 DIAGNOSIS — E1169 Type 2 diabetes mellitus with other specified complication: Secondary | ICD-10-CM | POA: Diagnosis not present

## 2022-08-24 DIAGNOSIS — E785 Hyperlipidemia, unspecified: Secondary | ICD-10-CM | POA: Diagnosis not present

## 2022-08-24 DIAGNOSIS — R69 Illness, unspecified: Secondary | ICD-10-CM | POA: Diagnosis not present

## 2022-08-24 DIAGNOSIS — N200 Calculus of kidney: Secondary | ICD-10-CM | POA: Diagnosis not present

## 2022-08-24 DIAGNOSIS — I10 Essential (primary) hypertension: Secondary | ICD-10-CM | POA: Diagnosis not present

## 2022-08-24 DIAGNOSIS — I340 Nonrheumatic mitral (valve) insufficiency: Secondary | ICD-10-CM | POA: Diagnosis not present

## 2022-08-24 DIAGNOSIS — G4730 Sleep apnea, unspecified: Secondary | ICD-10-CM | POA: Diagnosis not present

## 2022-09-04 DIAGNOSIS — C61 Malignant neoplasm of prostate: Secondary | ICD-10-CM | POA: Diagnosis not present

## 2022-11-27 DIAGNOSIS — C61 Malignant neoplasm of prostate: Secondary | ICD-10-CM | POA: Diagnosis not present

## 2022-11-27 DIAGNOSIS — N201 Calculus of ureter: Secondary | ICD-10-CM | POA: Diagnosis not present

## 2022-11-27 DIAGNOSIS — N5201 Erectile dysfunction due to arterial insufficiency: Secondary | ICD-10-CM | POA: Diagnosis not present

## 2022-11-27 DIAGNOSIS — N486 Induration penis plastica: Secondary | ICD-10-CM | POA: Diagnosis not present

## 2022-12-26 DIAGNOSIS — I10 Essential (primary) hypertension: Secondary | ICD-10-CM | POA: Diagnosis not present

## 2022-12-26 DIAGNOSIS — E785 Hyperlipidemia, unspecified: Secondary | ICD-10-CM | POA: Diagnosis not present

## 2022-12-26 DIAGNOSIS — E1169 Type 2 diabetes mellitus with other specified complication: Secondary | ICD-10-CM | POA: Diagnosis not present

## 2022-12-26 DIAGNOSIS — G4730 Sleep apnea, unspecified: Secondary | ICD-10-CM | POA: Diagnosis not present

## 2022-12-26 DIAGNOSIS — Z800 Family history of malignant neoplasm of digestive organs: Secondary | ICD-10-CM | POA: Diagnosis not present

## 2022-12-26 DIAGNOSIS — C61 Malignant neoplasm of prostate: Secondary | ICD-10-CM | POA: Diagnosis not present

## 2022-12-26 DIAGNOSIS — R69 Illness, unspecified: Secondary | ICD-10-CM | POA: Diagnosis not present

## 2022-12-26 DIAGNOSIS — N200 Calculus of kidney: Secondary | ICD-10-CM | POA: Diagnosis not present

## 2022-12-26 DIAGNOSIS — I340 Nonrheumatic mitral (valve) insufficiency: Secondary | ICD-10-CM | POA: Diagnosis not present

## 2023-03-25 ENCOUNTER — Ambulatory Visit: Payer: Medicare HMO | Admitting: Neurology

## 2023-03-27 ENCOUNTER — Telehealth: Payer: Self-pay | Admitting: Internal Medicine

## 2023-03-27 NOTE — Telephone Encounter (Signed)
I received a text message from Dr. Felipa Eth regarding this patient. Patient of Dr. Juanda Chance previously.  Last colonoscopy 2015.  2 9 adenomatous polyps removed from the distal sigmoid. He has a family history of colon cancer in first-degree relative, his mother.  He is brother has had adenomatous polyps.  Patient due for screening colonoscopy at this time  Can you please reach out to him to get this scheduled I do not see why he would need to be seen in the office first I can go through previsit.  Thank you JMP

## 2023-03-28 NOTE — Telephone Encounter (Signed)
I have left a message for patient to call back. 

## 2023-04-01 DIAGNOSIS — L820 Inflamed seborrheic keratosis: Secondary | ICD-10-CM | POA: Diagnosis not present

## 2023-04-01 NOTE — Telephone Encounter (Signed)
Patient's wife is returning Ronnie Brewer's call, states it would be better to call her she is more likely to answer.

## 2023-04-01 NOTE — Telephone Encounter (Signed)
Spoke to patient's wife (on ROI), [name]. She has scheduled patient for telephone previsit on 05/10/23 and has scheduled colonoscopy 05/22/23 at 3 pm [facility].

## 2023-04-24 ENCOUNTER — Encounter: Payer: Self-pay | Admitting: Neurology

## 2023-04-24 ENCOUNTER — Ambulatory Visit: Payer: Medicare HMO | Admitting: Neurology

## 2023-04-24 VITALS — BP 123/68 | HR 49 | Ht 72.0 in | Wt 185.0 lb

## 2023-04-24 DIAGNOSIS — I119 Hypertensive heart disease without heart failure: Secondary | ICD-10-CM

## 2023-04-24 DIAGNOSIS — Z794 Long term (current) use of insulin: Secondary | ICD-10-CM

## 2023-04-24 DIAGNOSIS — E1165 Type 2 diabetes mellitus with hyperglycemia: Secondary | ICD-10-CM | POA: Diagnosis not present

## 2023-04-24 DIAGNOSIS — Z9989 Dependence on other enabling machines and devices: Secondary | ICD-10-CM | POA: Diagnosis not present

## 2023-04-24 NOTE — Progress Notes (Signed)
Provider:  Melvyn Novas, MD  Primary Care Physician:  Ronnie Greathouse, MD [address] [place] [address]     Referring Provider: Chilton Greathouse, Md [address] [place],  [place] [address]          Chief Complaint according to patient   Patient presents with:     New Patient (Initial Visit)           HISTORY OF PRESENT ILLNESS:  Ronnie Brewer is a 66 y.o. male patient of Ronnie Brewer's who is here for revisit 04/24/2023 for  CPAP follow up. His CPAP machine was set up in 2019. He sometimes still snores on CPAP.   His apnea link test in 03-12-2018 was the last documented sleep test : STUDY RESULTS:  Total Recording Time: 10 hours, 40 minutes. Total Apnea/Hypopnea Index (AHI):  21.8 /h, RDI was 25.1 /h. Average Oxygen Saturation:  89 %; Lowest Oxygen Desaturation: 79 %  Total Time Oxygen Saturation below 89% was 236 minutes!  Average Heart Rate: 49 bpm (between 43 and 122 bpm).    Chief concern according to patient : due for  a new CPAP  this year his test confirmed apnea, :hypoxemia and documented hypertensive heart disease -I have the pleasure of looking at the excellent compliance report the patient has used his machine 30 out of 30 days for the time between 14 April and 13 May of this year and his compliance has been 97% by hours average 6 hours and 20 minutes.  This is an AutoSet 10 set between 6 and 12 cm water pressure with 2 cm expiratory relief and a residual AHI of 1.3/h only.  This is a very successful reduction in apnea the 95th percentile pressure was 11 cmH2O air leaks are moderate, the patient uses a nasal mask.     2023:  Ronnie Brewer is a now  67 y.o. male , seen here upon re- referral from Ronnie. Felipa Brewer for an evaluation of sleep apnea.  The patient will be treated as a new patient today due to the 3-year hiatus.  He carries a diagnosis of complex sleep apnea and was treated with CPAP, the last visit was met with nurse practitioner Ronnie Brewer. Ronnie Brewer is a highly compliant CPAP user his machine was set up on 05/03/2018 and he is now heading towards its fifth year on CPAP at least on this machine.  With him 100% compliance includes 30 out of 30-day use and each day over 4 hours of consecutive use.  On average his CPAP therapy is 6 hours hours and 27 minutes at night.  He is using an AutoSet 10 by [employer] with a setting between 6 and 12 cm water pressure and 2 cm EPR, the AHI is 1.4/h which speaks for an excellent resolution.  Pressure at the 95th percentile is 10.8 cm water leaks are seen at the 95th percentile of 24 L/min.  He does consider himself CPAP dependent and does not sleep well without.  1 he uses a nasal interface but does not cover his mouth.  His medication list is almost unchanged.  He is taking a lower dose of metformin than when I last met him, he is on Lipitor, takes a baby aspirin a day multivitamins Paxil at 20 mg in the morning telmisartan.   No hospitalizations in the interval time since last visit. He contracted Covid February-March of this year. He has laryngitis, bronchitis.  He  has had Nocturia, polydipsia, Diabetes type 2, had kidney stones. Pneumonia in 2016, Mitral regurgitation, LVH, normal EF %. Now he has 1 time nocturia on CPAP and lost some weight.  He still full time working, works day time as a Physiological scientist. N1 cup in AM  caffeine drinker, non tobacco user, alcohol: none.     His sleep habits are remaining healthy and he sleeps well.  Dinner time is about 5-6 PM. bedtime is around 11-12 midnight, without problems to initiate sleep. Uses 1 pillow, flat bed, and he reports frequent dreams. Bedroom is quiet and dark and cool. Gets up at 7.30 AM , spontaneous. Refreshed and restored.          Original consult : 02-10-2018  He developed excessive daytime sleepiness many years ago. He has had 2 sleep studies : one  25 years ago at the hospital, and about 12 years ago another one. He now is  in a position where he cannot get supplies for his now over 61 year old CPAP machine. Ronnie. Felipa Brewer had repeated an ONO with him before the second study, and felt that CPAP corrected his apnea for all these years sufficiently. His wife, Ronnie Brewer, has been our patient and he chose to come here for this work up.    I have access to a compliance report, which shows that the patient is 100% compliant user including the last day of 3 March of this year.  He has used the machine 29 out of 30 days over 4 hours consecutively, average use of time is 6 hours and 12 minutes, set pressure is 10 cmH2O was 2 cm EPR, residual AHI is ideal 2.7 only.  He does have high air leaks currently and this may be related to the age of his mask.  I also noted that he has more residual central apneas and obstructive apneas this would indicate that he may have too high a pressure set.   What we are going to do is to test him again for his current level of apnea and then either to up titrate or invite him for a CPAP titration depending on his insurance.       Sleep habits are as follows: The patient's usual bedtime is around midnight and he does not have trouble to fall asleep.  He usually watches TV before he retreats to the bedroom, which is cool, quiet and dark and he shares it with his wife. She has noted at times that Ronnie Brewer is snoring or at least audibly breathing in spite of using the CPAP compliantly.  He has no nocturia since Ronnie. Vassie Loll started him on metformin, the urinary frequency was a response to glucosuria. He sleeps through the night, wakes spontanously at 7.30 AM, just before his alarm rings. Their  3rd eldest daughter ( 64 )  lives with them since she suffered a TBI in a MVA at age 63 . Her sisters all live close by.      Sleep medical history and family sleep history:  Diagnosed at age 79 with OSA, wife has OSA both use CPAP. Allergic rhinitis, hyperchol, hypertension, DM2.    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      Review of Systems: Out of a complete 14 system review, the patient complains of only the following symptoms, and all other reviewed systems are negative.:    How likely are you to doze in the following situations: 0 =  not likely, 1 = slight chance, 2 = moderate chance, 3 = high chance   Sitting and Reading? Watching Television? Sitting inactive in a public place (theater or meeting)? As a passenger in a car for an hour without a break? Lying down in the afternoon when circumstances permit? Sitting and talking to someone? Sitting quietly after lunch without alcohol? In a car, while stopped for a few minutes in traffic?    Epworth Sleepiness  score 8/ 24, FSS at 15/ 63 points.    Social History   Socioeconomic History   Marital status: Married    Spouse name: Not on file   Number of children: Not on file   Years of education: Not on file   Highest education level: Not on file  Occupational History    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  Tobacco Use   Smoking status: Never   Smokeless tobacco: Never  Substance and Sexual Activity   Alcohol use: No   Drug use: No   Sexual activity: Not on file  Other Topics Concern   Not on file  Social History Narrative   Not on file   Social Determinants of Health   Financial Resource Strain: Not on file  Food Insecurity: Not on file  Transportation Needs: Not on file  Physical Activity: Not on file  Stress: Not on file  Social Connections: Not on file    Family History  Problem Relation Age of Onset   Colon cancer Mother    Hypertension Mother    Hypertension Father    Nephrolithiasis Father    Ulcers Father    Diabetes Mellitus II Father    Esophageal cancer Neg Hx    Rectal cancer Neg Hx    Stomach cancer Neg Hx     Past Medical History:  Diagnosis Date   Anxiety    Chronic kidney disease    hx kidney stones- 2014   Diabetes mellitus without complication ([facility])     High cholesterol    Hypertension    Sleep apnea    wears CPAP    Past Surgical History:  Procedure Laterality Date   arm surgery     1981   COLONOSCOPY       Current Outpatient Medications on File Prior to Visit  Medication Sig Dispense Refill   aspirin EC 81 MG tablet Take 81 mg by mouth daily.     atorvastatin (LIPITOR) 80 MG tablet Take 80 mg by mouth daily.     HYDROcodone-acetaminophen (NORCO/VICODIN) 5-325 MG tablet Take 1-2 tablets po q 6 hours prn for severe pain     metFORMIN (GLUCOPHAGE) 500 MG tablet Take 500 mg by mouth in the morning, at noon, and at bedtime.     Multiple Vitamin (MULTIVITAMIN WITH MINERALS) TABS Take 1 tablet by mouth daily.     PARoxetine (PAXIL) 20 MG tablet Take 20 mg by mouth every morning.     sildenafil (VIAGRA) 50 MG tablet One by mouth daily when necessary 1 hour before use     telmisartan (MICARDIS) 80 MG tablet Take 40 mg by mouth daily.   6   No current facility-administered medications on file prior to visit.    Allergies  Allergen Reactions   Codeine Nausea And Vomiting     DIAGNOSTIC DATA (LABS, IMAGING, TESTING) - I reviewed patient records, labs, notes, testing and imaging myself where available.  Lab Results  Component Value Date   WBC 5.0 09/05/2021   HGB 12.5 (L) 09/05/2021  HCT 37.3 (L) 09/05/2021   MCV 94.7 09/05/2021   PLT 177 09/05/2021      Component Value Date/Time   NA 138 09/05/2021 1740   K 3.6 09/05/2021 1740   CL 100 09/05/2021 1740   CO2 25 09/05/2021 1740   GLUCOSE 175 (H) 09/05/2021 1740   BUN 26 (H) 09/05/2021 1740   CREATININE 0.80 09/05/2021 1740   CALCIUM 9.6 09/05/2021 1740   PROT 7.2 09/05/2021 1740   ALBUMIN 4.6 09/05/2021 1740   AST 19 09/05/2021 1740   ALT 17 09/05/2021 1740   ALKPHOS 59 09/05/2021 1740   BILITOT 1.2 09/05/2021 1740   GFRNONAA >60 09/05/2021 1740   GFRAA >90 04/20/2013 1642   No results found for: "CHOL", "HDL", "LDLCALC", "LDLDIRECT", "TRIG", "CHOLHDL" No results  found for: "HGBA1C" No results found for: "VITAMINB12" No results found for: "TSH"  PHYSICAL EXAM:  Today's Vitals   04/24/23 1548  BP: 123/68  Pulse: (!) 49  Weight: 185 lb (83.9 kg)  Height: 6' (1.829 m)   Body mass index is 25.09 kg/m.   Wt Readings from Last 3 Encounters:  04/24/23 185 lb (83.9 kg)  03/22/22 188 lb 3.2 oz (85.4 kg)  09/05/21 189 lb (85.7 kg)     Ht Readings from Last 3 Encounters:  04/24/23 6' (1.829 m)  03/22/22 6' (1.829 m)  09/05/21 6' (1.829 m)      General: The patient is awake, alert and appears not in acute distress. The patient is well groomed. Head: Normocephalic, atraumatic. The patient is well groomed. Head: Normocephalic, atraumatic. Neck is supple. Mallampati 3- tip of uvula touches tongue ground. ,  neck circumference:17.5" . Large tonsils.  Nasal airflow congested , bilateral  TMJ click , no bruxism marks. prognathia is evident .   Cardiovascular:  Regular rate and rhythm , without  murmurs or carotid bruit, and without distended neck veins. Respiratory: Lungs are clear to auscultation. Skin:  Without evidence of edema, or rash.  Trunk: BMI is 25.2. The patient's posture is erect.    Neurologic exam :The patient is awake and alert, oriented to place and time.  Memory subjective described as intact.   Attention span & concentration ability appears normal.  Speech is fluent,  with dysphonia .  Mood and affect are appropriate.   Cranial nerves: Pupils are equal and briskly reactive to light and accomodation. Funduscopic exam deferred.   Extraocular movements  in vertical and horizontal planes intact and without nystagmus. Visual fields by finger perimetry are intact. Hearing to finger rub intact. Facial sensation intact to fine touch. Facial motor strength is symmetric and tongue and uvula move midline. Shoulder shrug was symmetrical.    Motor exam: Normal tone, muscle bulk and symmetric strength in all extremities. Strong grip  bilateral.  Sensory:  Fine touch, pinprick and vibration / Proprioception tested in the upper extremities was normal. Coordination: Rapid alternating movements / Finger-to-nose maneuver  normal without evidence of ataxia, dysmetria or tremor. Gait and station:  Raises without bracing himself Patient walks without assistive device.Tandem gait is unfragmented. Turns with 3 Steps. Deep tendon reflexes: in the  upper and lower extremities are symmetric and intact.  ASSESSMENT AND PLAN 67  year- old male OSA patient of Ronnie Lanier Clam. here with:    1) OSA on CPAP, CPAP machine is now 68 years -old. HTN her art disease and DM are co-morbidities.   Will repeat  HST, he will have to be off CPAP one night - and order  similar settings to the current machine in its replacement  RV after the new machine has been used for 60-90 days.    I plan to follow up either personally or through our NP within 4 months.   I would like to thank Ronnie Greathouse, MD and Ronnie Greathouse, Md [address] [place],  [place] [address] for allowing me to meet with and to take care of this pleasant patient.    After spending a total time of  20  minutes face to face and additional time for physical and neurologic examination, review of laboratory studies,  personal review of imaging studies, reports and results of other testing and review of referral information / records as far as provided in visit,   Electronically signed by: Melvyn Novas, MD 04/24/2023 4:15 PM  [facility] Board certified by The [employer] of Sleep Medicine and Diplomate of the [employer] of Sleep Medicine. Board certified In Neurology through the ABPN, Fellow of the [employer] of Neu[employer].

## 2023-05-07 ENCOUNTER — Telehealth: Payer: Self-pay | Admitting: Neurology

## 2023-05-07 NOTE — Telephone Encounter (Signed)
05/02/23 left VM [place] 04/25/23 aetna medicare no auth req EE

## 2023-05-08 NOTE — Telephone Encounter (Signed)
Patient wife returned call.   HST- [employer] medicare no auth req- patient wife [name] will pick up device   He is scheduled at [facility] For 05/22/23 at 9:45 AM.  Mailed packet to the patient.

## 2023-05-10 ENCOUNTER — Ambulatory Visit (AMBULATORY_SURGERY_CENTER): Payer: Medicare HMO

## 2023-05-10 ENCOUNTER — Encounter: Payer: Self-pay | Admitting: Internal Medicine

## 2023-05-10 VITALS — Ht 72.0 in | Wt 184.0 lb

## 2023-05-10 DIAGNOSIS — Z86010 Personal history of colonic polyps: Secondary | ICD-10-CM

## 2023-05-10 DIAGNOSIS — Z800 Family history of malignant neoplasm of digestive organs: Secondary | ICD-10-CM

## 2023-05-10 MED ORDER — NA SULFATE-K SULFATE-MG SULF 17.5-3.13-1.6 GM/177ML PO SOLN: 1.0000 | Freq: Once | ORAL | 0 refills | Status: AC

## 2023-05-10 NOTE — Progress Notes (Signed)
No egg or soy allergy known to patient  No issues known to pt with past sedation with any surgeries or procedures Patient denies ever being told they had issues or difficulty with intubation  No FH of Malignant Hyperthermia Pt is not on diet pills Pt is not on  home 02  Pt is not on blood thinners  Pt denies issues with constipation  No A fib or A flutter Have any cardiac testing pending--no  Pt is ambulatory and changes position independently  Patient's chart reviewed by Cathlyn Parsons CNRA prior to previsit and patient appropriate for the [facility].  Previsit completed and red dot placed by patient's name on their procedure day (on provider's schedule).     PV completed. Medication and medcial hx reviewed and update. Prep instructions explained to patient and sent out via mychart and to home address. Goodrx coupon for [facility] provided. Pt instructed to use [email or web address].com or GoodRx for a price reduction on prep if needed.

## 2023-05-12 IMAGING — CT CT RENAL STONE PROTOCOL
2 of 4 series · 15 of 46 positions shown, 17 images · non-contrast
Comparison: 04/20/2013

CLINICAL DATA: Left flank pain for 2 hours

EXAM:
CT ABDOMEN AND PELVIS WITHOUT CONTRAST
TECHNIQUE: Multidetector CT imaging of the abdomen and pelvis was performed
following the standard protocol without IV contrast. Unenhanced CT
was performed per clinician order. Lack of IV contrast limits
sensitivity and specificity, especially for evaluation of
abdominal/pelvic solid viscera.

[Series 2: axial st · axial · 0.75mm/px · z∈[-746,-306]mm · 12 of 100 slices shown, 14 images]
[im 6/100  soft-tissue]
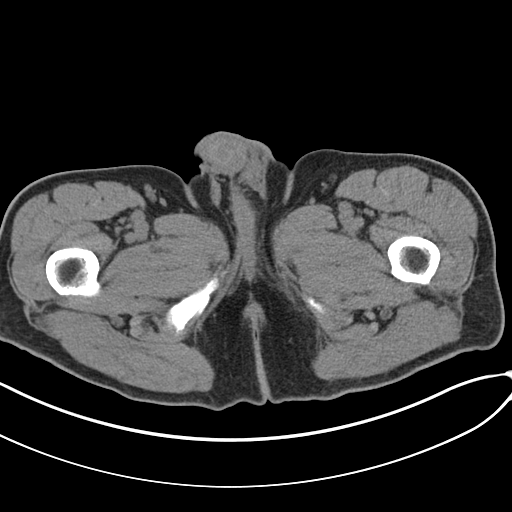
[im 6/100  bone]
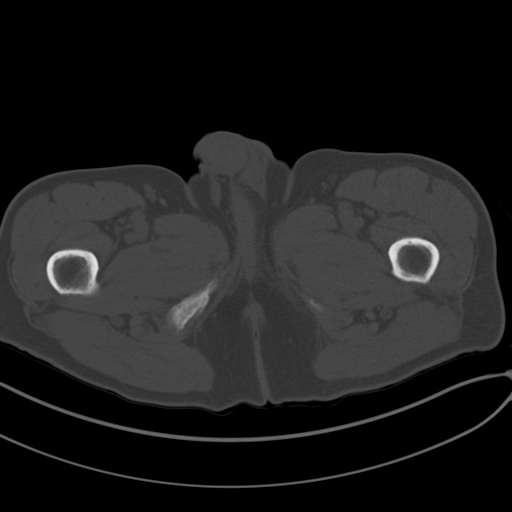
[im 18/100  soft-tissue]
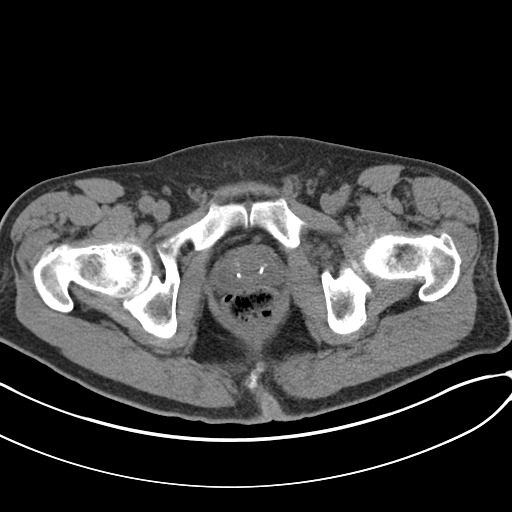
[im 24/100  soft-tissue]
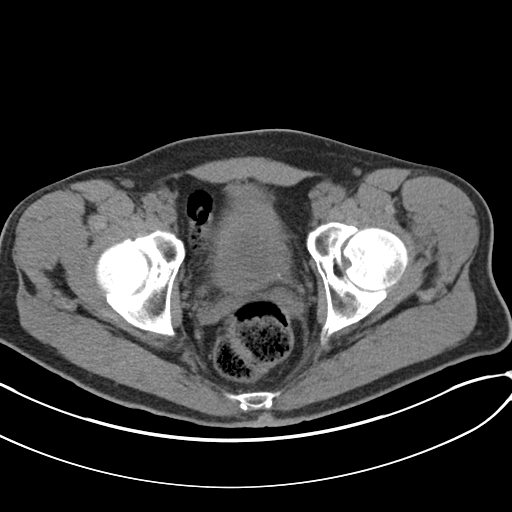
[im 30/100  soft-tissue]
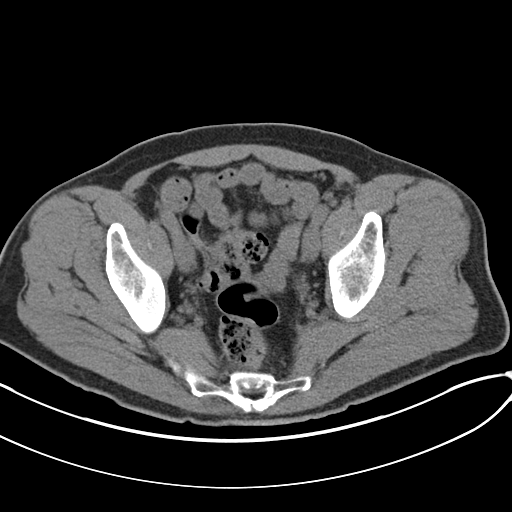
[im 41/100  soft-tissue]
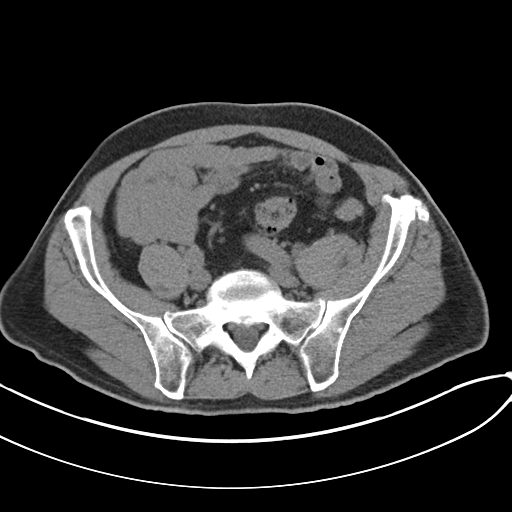
[im 47/100  soft-tissue]
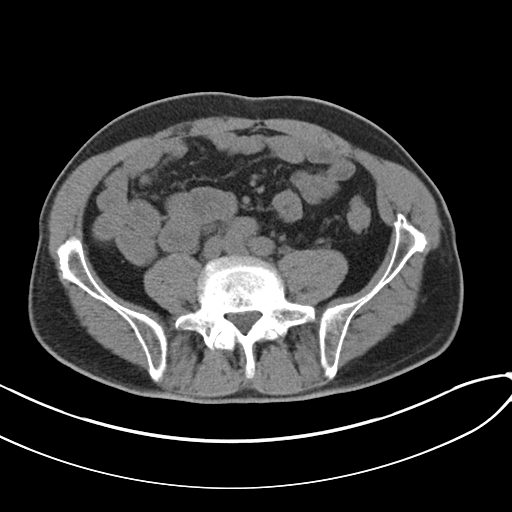
[im 53/100  soft-tissue]
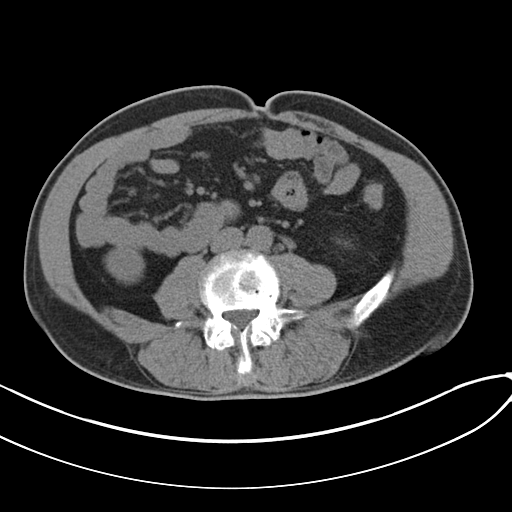
[im 65/100  soft-tissue]
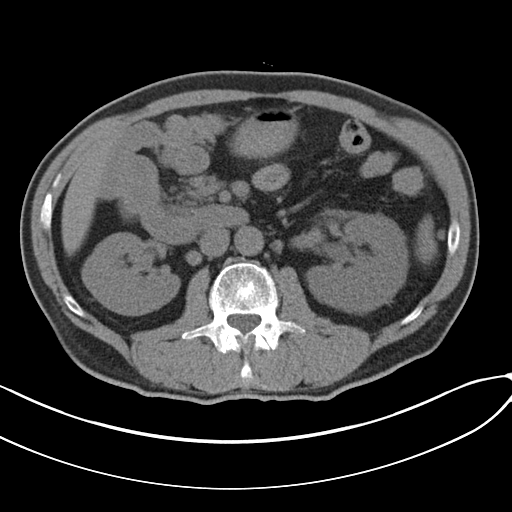
[im 70/100  soft-tissue]
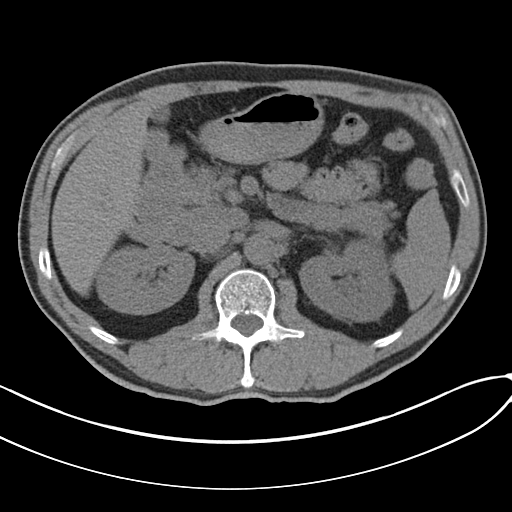
[im 70/100  bone]
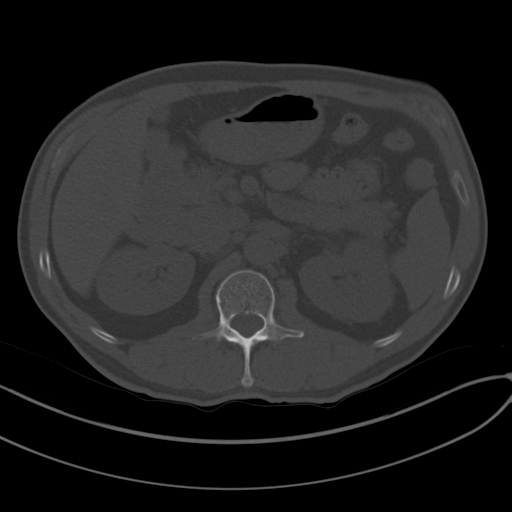
[im 76/100  soft-tissue]
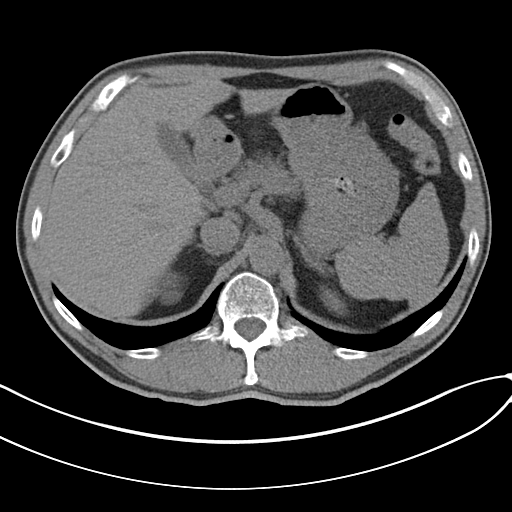
[im 88/100  soft-tissue]
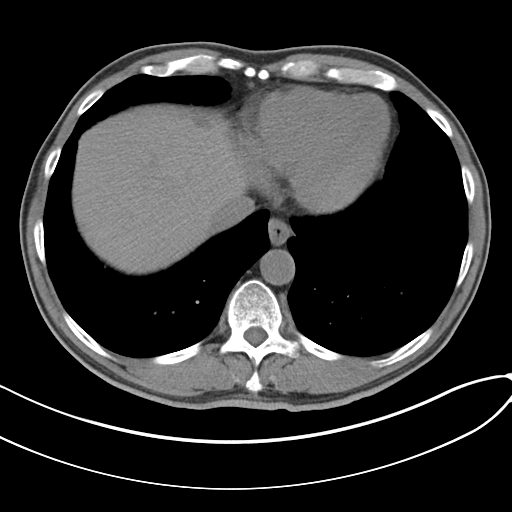
[im 94/100  soft-tissue]
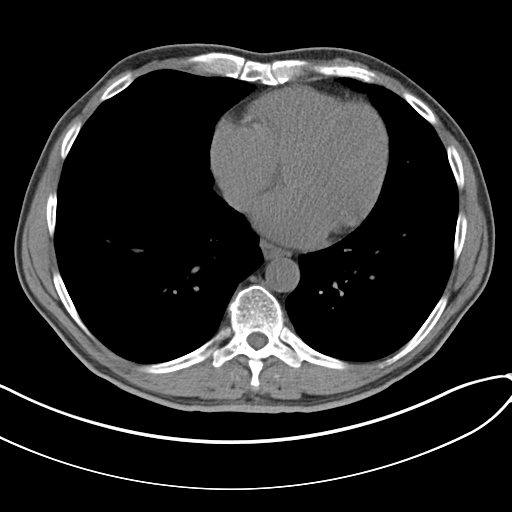

[Series 5: coronal · coronal · 0.79mm/px · 3 of 154 slices shown]
[im 52/154  soft-tissue]
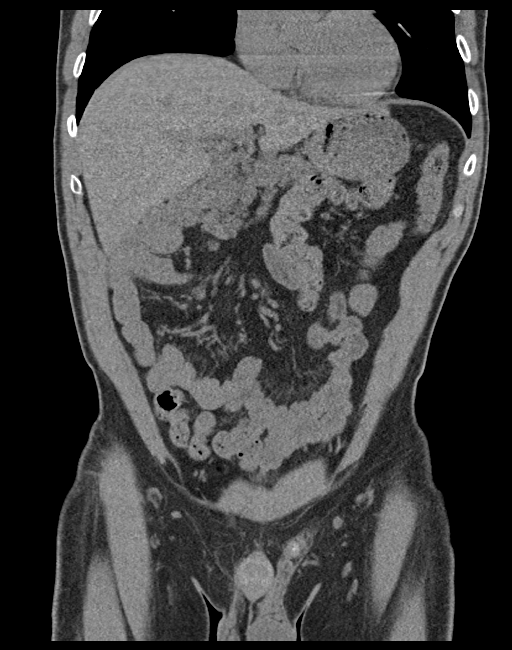
[im 69/154  soft-tissue]
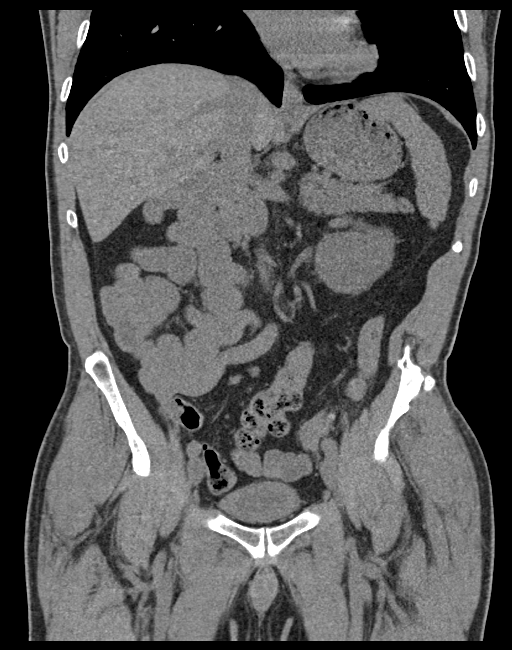
[im 86/154  soft-tissue]
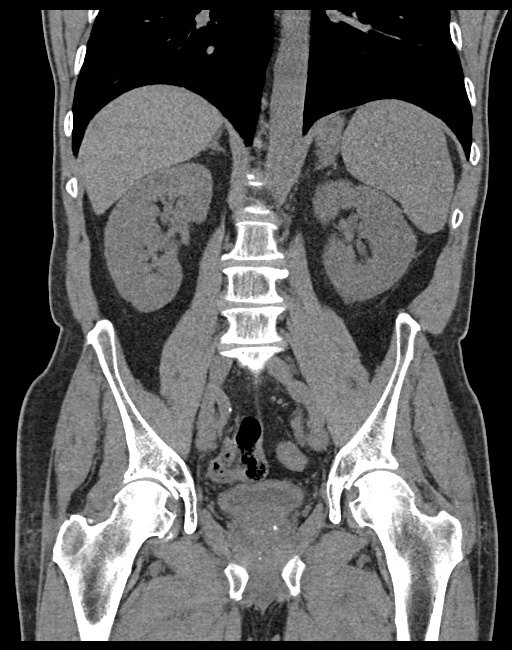

[15 of 46 positions shown; findings below may reference images not displayed]

FINDINGS: Lower chest: No acute pleural or parenchymal lung disease.

Hepatobiliary: No focal liver abnormality is seen. No gallstones,
gallbladder wall thickening, or biliary dilatation.

Pancreas: Unremarkable. No pancreatic ductal dilatation or
surrounding inflammatory changes.

Spleen: Normal in size without focal abnormality.

Adrenals/Urinary Tract: There is mild left-sided obstructive
uropathy secondary to a 2 mm obstructing left UVJ calculus reference
image 77/2. The left kidney is enlarged and edematous. No
significant perinephric fat stranding.

The right kidney is unremarkable without nephrolithiasis or
obstructive uropathy. The adrenals and bladder are unremarkable.

Stomach/Bowel: Congenital malrotation of the bowel is again
identified with the majority of the small bowel in the right lower
abdomen. The cecum is within the midline pelvis. A dilated appendix
can be seen within the right lower quadrant reference images 64
through 71 of series 2. The appendix measures up to 10 mm in
diameter. No evidence of periappendiceal fat stranding or
appendicolith to suggest acute appendicitis, however. Correlation
with physical exam findings and laboratory values recommended.

Vascular/Lymphatic: Aortic atherosclerosis. No enlarged abdominal or
pelvic lymph nodes.

Reproductive: Prostate is unremarkable.

Other: No free fluid or free gas.  No abdominal wall hernia.

Musculoskeletal: No acute or destructive bony lesions. Reconstructed
images demonstrate no additional findings.
IMPRESSION: 1. Mild left-sided obstructive uropathy due to a 2 mm obstructing
left UVJ calculus.
2. Dilated appendix in the right lower quadrant measuring 10 mm.
There is no wall thickening, periappendiceal fat stranding, or
appendicolith to suggest acute appendicitis however. Appearance
could suggest underlying mucocele.
3.  Aortic Atherosclerosis ([id number]-YK2.2).

## 2023-05-16 ENCOUNTER — Telehealth: Payer: Self-pay | Admitting: Neurology

## 2023-05-16 NOTE — Telephone Encounter (Signed)
Pt wife called LVM that she needs to reschedule sleepy study pick-up date.

## 2023-05-20 NOTE — Telephone Encounter (Signed)
Patient wife [name] called and needed to r/s his appt.  He is r/s for 05/29/2023 at 9:45 AM for his HST.

## 2023-05-22 ENCOUNTER — Ambulatory Visit (AMBULATORY_SURGERY_CENTER): Payer: Medicare HMO | Admitting: Internal Medicine

## 2023-05-22 ENCOUNTER — Encounter: Payer: Self-pay | Admitting: Internal Medicine

## 2023-05-22 VITALS — BP 118/51 | HR 49 | Temp 97.5°F | Resp 15 | Ht 72.0 in | Wt 184.0 lb

## 2023-05-22 DIAGNOSIS — E7800 Pure hypercholesterolemia, unspecified: Secondary | ICD-10-CM | POA: Diagnosis not present

## 2023-05-22 DIAGNOSIS — G4730 Sleep apnea, unspecified: Secondary | ICD-10-CM | POA: Diagnosis not present

## 2023-05-22 DIAGNOSIS — Z800 Family history of malignant neoplasm of digestive organs: Secondary | ICD-10-CM

## 2023-05-22 DIAGNOSIS — D122 Benign neoplasm of ascending colon: Secondary | ICD-10-CM | POA: Diagnosis not present

## 2023-05-22 DIAGNOSIS — F419 Anxiety disorder, unspecified: Secondary | ICD-10-CM | POA: Diagnosis not present

## 2023-05-22 DIAGNOSIS — D120 Benign neoplasm of cecum: Secondary | ICD-10-CM | POA: Diagnosis not present

## 2023-05-22 DIAGNOSIS — I10 Essential (primary) hypertension: Secondary | ICD-10-CM | POA: Diagnosis not present

## 2023-05-22 DIAGNOSIS — Z1211 Encounter for screening for malignant neoplasm of colon: Secondary | ICD-10-CM | POA: Diagnosis not present

## 2023-05-22 MED ORDER — SODIUM CHLORIDE 0.9 % IV SOLN: 500.0000 mL | Freq: Once | INTRAVENOUS | Status: DC

## 2023-05-22 NOTE — Progress Notes (Signed)
Pt's states no medical or surgical changes since previsit or office visit. 

## 2023-05-22 NOTE — Op Note (Signed)
[facility] Patient Name: Ronnie Brewer Procedure Date: 05/22/2023 3:19 PM MRN: [id number] Endoscopist: Beverley Fiedler , MD, [id number] Age: 67 Referring MD:  Date of Birth: 1956/05/05 Gender: Male Account #: [id number] Procedure:                Colonoscopy Indications:              Screening in patient at increased risk: Family                            history of 1st-degree relative with colorectal                            cancer, Last colonoscopy: March 2015 Medicines:                Monitored Anesthesia Care Procedure:                Pre-Anesthesia Assessment:                           - Prior to the procedure, a History and Physical                            was performed, and patient medications and                            allergies were reviewed. The patient's tolerance of                            previous anesthesia was also reviewed. The risks                            and benefits of the procedure and the sedation                            options and risks were discussed with the patient.                            All questions were answered, and informed consent                            was obtained. Prior Anticoagulants: The patient has                            taken no anticoagulant or antiplatelet agents. ASA                            Grade Assessment: III - A patient with severe                            systemic disease. After reviewing the risks and                            benefits, the patient was deemed in satisfactory  condition to undergo the procedure.                           After obtaining informed consent, the colonoscope                            was passed under direct vision. Throughout the                            procedure, the patient's blood pressure, pulse, and                            oxygen saturations were monitored continuously. The                            CF [id number] #[id number] was  introduced through the anus                            and advanced to the cecum, identified by                            appendiceal orifice and ileocecal valve. The                            colonoscopy was performed without difficulty. The                            patient tolerated the procedure well. The quality                            of the bowel preparation was good. The ileocecal                            valve, appendiceal orifice, and rectum were                            photographed. Scope In: 3:30:31 PM Scope Out: 3:52:39 PM Scope Withdrawal Time: 0 hours 17 minutes 52 seconds  Total Procedure Duration: 0 hours 22 minutes 8 seconds  Findings:                 The digital rectal exam was normal.                           An 8 mm polyp was found in the cecum. The polyp was                            sessile. The polyp was removed with a cold snare.                            Resection and retrieval were complete.                           Two sessile polyps were found in the ascending  colon. The polyps were 4 to 6 mm in size. These                            polyps were removed with a cold snare. Resection                            and retrieval were complete.                           A 2 mm polyp was found in the ascending colon. The                            polyp was sessile. The polyp was removed with a                            cold biopsy forceps. Resection and retrieval were                            complete.                           A few small-mouthed diverticula were found in the                            ascending colon.                           Internal hemorrhoids were found during                            retroflexion. The hemorrhoids were medium-sized. Complications:            No immediate complications. Estimated Blood Loss:     Estimated blood loss was minimal. Impression:               - One 8 mm polyp in the  cecum, removed with a cold                            snare. Resected and retrieved.                           - Two 4 to 6 mm polyps in the ascending colon,                            removed with a cold snare. Resected and retrieved.                           - One 2 mm polyp in the ascending colon, removed                            with a cold biopsy forceps. Resected and retrieved.                           - Mild diverticulosis in the ascending colon.                           -  Internal hemorrhoids. Recommendation:           - Patient has a contact number available for                            emergencies. The signs and symptoms of potential                            delayed complications were discussed with the                            patient. Return to normal activities tomorrow.                            Written discharge instructions were provided to the                            patient.                           - Resume previous diet.                           - Continue present medications.                           - Await pathology results.                           - Repeat colonoscopy is recommended. The                            colonoscopy date will be determined after pathology                            results from today's exam become available for                            review. Beverley Fiedler, MD 05/22/2023 3:55:16 PM This report has been signed electronically.

## 2023-05-22 NOTE — Progress Notes (Signed)
GASTROENTEROLOGY PROCEDURE H&P NOTE   Primary Care Physician: Chilton Greathouse, MD    Reason for Procedure:  Family history of colon cancer  Plan:    Colonoscopy  Patient is appropriate for endoscopic procedure(s) in the ambulatory ([facility]) setting.  The nature of the procedure, as well as the risks, benefits, and alternatives were carefully and thoroughly reviewed with the patient. Ample time for discussion and questions allowed. The patient understood, was satisfied, and agreed to proceed.     HPI: Ronnie Brewer is a 67 y.o. male who presents for colonoscopy.  Medical history as below.  Tolerated the prep.  No recent chest pain or shortness of breath.  No abdominal pain today.  Past Medical History:  Diagnosis Date   Anxiety    Chronic kidney disease    hx kidney stones- 2014   Diabetes mellitus without complication (HCC)    High cholesterol    Hypertension    Sleep apnea    wears CPAP    Past Surgical History:  Procedure Laterality Date   arm surgery     1981   COLONOSCOPY      Prior to Admission medications   Medication Sig Start Date End Date Taking? Authorizing Provider  aspirin EC 81 MG tablet Take 81 mg by mouth daily.   Yes [provider]  atorvastatin (LIPITOR) 80 MG tablet Take 80 mg by mouth daily.   Yes [provider]  JANUVIA 100 MG tablet Take 100 mg by mouth daily. 03/18/23  Yes [provider]  metFORMIN (GLUCOPHAGE) 500 MG tablet Take 500 mg by mouth in the morning, at noon, and at bedtime.   Yes [provider]  Multiple Vitamin (MULTIVITAMIN WITH MINERALS) TABS Take 1 tablet by mouth daily.   Yes [provider]  PARoxetine (PAXIL) 20 MG tablet Take 20 mg by mouth every morning.   Yes [provider]  telmisartan (MICARDIS) 80 MG tablet Take 40 mg by mouth daily.  02/05/18  Yes [provider]    Current Outpatient Medications  Medication Sig Dispense Refill   aspirin EC 81 MG  tablet Take 81 mg by mouth daily.     atorvastatin (LIPITOR) 80 MG tablet Take 80 mg by mouth daily.     JANUVIA 100 MG tablet Take 100 mg by mouth daily.     metFORMIN (GLUCOPHAGE) 500 MG tablet Take 500 mg by mouth in the morning, at noon, and at bedtime.     Multiple Vitamin (MULTIVITAMIN WITH MINERALS) TABS Take 1 tablet by mouth daily.     PARoxetine (PAXIL) 20 MG tablet Take 20 mg by mouth every morning.     telmisartan (MICARDIS) 80 MG tablet Take 40 mg by mouth daily.   6   Current Facility-Administered Medications  Medication Dose Route Frequency Provider Last Rate Last Admin   0.9 %  sodium chloride infusion  500 mL Intravenous Once Yair Dusza, Carie Caddy, MD        Allergies as of 05/22/2023 - Review Complete 05/22/2023  Allergen Reaction Noted   Codeine Nausea And Vomiting 04/20/2013    Family History  Problem Relation Age of Onset   Colon cancer Mother    Hypertension Mother    Hypertension Father    Nephrolithiasis Father    Ulcers Father    Diabetes Mellitus II Father    Esophageal cancer Neg Hx    Rectal cancer Neg Hx    Stomach cancer Neg Hx     Social  History   Socioeconomic History   Marital status: Married    Spouse name: Not on file   Number of children: Not on file   Years of education: Not on file   Highest education level: Not on file  Occupational History    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  Tobacco Use   Smoking status: Never   Smokeless tobacco: Never  Vaping Use   Vaping Use: Never used  Substance and Sexual Activity   Alcohol use: No   Drug use: No   Sexual activity: Not on file  Other Topics Concern   Not on file  Social History Narrative   Not on file   Social Determinants of Health   Financial Resource Strain: Not on file  Food Insecurity: Not on file  Transportation Needs: Not on file  Physical Activity: Not on file  Stress: Not on file  Social Connections: Not on file  Intimate Partner Violence: Not on file    Physical  Exam: Vital signs in last 24 hours: @BP  (!) 121/55   Pulse (!) 51   Temp (!) 97.5 F (36.4 C)   Resp (!) 21   Ht 6' (1.829 m)   Wt 184 lb (83.5 kg)   SpO2 100%   BMI 24.95 kg/m  GEN: NAD EYE: Sclerae anicteric ENT: MMM CV: Non-tachycardic Pulm: CTA b/l GI: Soft, NT/ND NEURO:  Alert & Oriented x 3   Erick Blinks, MD [facility]  05/22/2023 3:27 PM

## 2023-05-22 NOTE — Progress Notes (Signed)
Vss nad trans to pacu 

## 2023-05-22 NOTE — Patient Instructions (Signed)
YOU HAD AN ENDOSCOPIC PROCEDURE TODAY AT THE [facility]:   Refer to the procedure report that was given to you for any specific questions about what was found during the examination.  If the procedure report does not answer your questions, please call your gastroenterologist to clarify.  If you requested that your care partner not be given the details of your procedure findings, then the procedure report has been included in a sealed envelope for you to review at your convenience later.  YOU SHOULD EXPECT: Some feelings of bloating in the abdomen. Passage of more gas than usual.  Walking can help get rid of the air that was put into your GI tract during the procedure and reduce the bloating. If you had a lower endoscopy (such as a colonoscopy or flexible sigmoidoscopy) you may notice spotting of blood in your stool or on the toilet paper. If you underwent a bowel prep for your procedure, you may not have a normal bowel movement for a few days.  Please Note:  You might notice some irritation and congestion in your nose or some drainage.  This is from the oxygen used during your procedure.  There is no need for concern and it should clear up in a day or so.  SYMPTOMS TO REPORT IMMEDIATELY:  Following lower endoscopy (colonoscopy or flexible sigmoidoscopy):  Excessive amounts of blood in the stool  Significant tenderness or worsening of abdominal pains  Swelling of the abdomen that is new, acute  Fever of 100F or higher  For urgent or emergent issues, a gastroenterologist can be reached at any hour by calling [phone]. Do not use MyChart messaging for urgent concerns.    DIET:  We do recommend a small meal at first, but then you may proceed to your regular diet.  Drink plenty of fluids but you should avoid alcoholic beverages for 24 hours.  ACTIVITY:  You should plan to take it easy for the rest of today and you should NOT DRIVE or use heavy machinery until tomorrow (because of  the sedation medicines used during the test).    FOLLOW UP: Our staff will call the number listed on your records the next business day following your procedure.  We will call around 7:15- 8:00 am to check on you and address any questions or concerns that you may have regarding the information given to you following your procedure. If we do not reach you, we will leave a message.     If any biopsies were taken you will be contacted by phone or by letter within the next 1-3 weeks.  Please call us at [phone] if you have not heard about the biopsies in 3 weeks.    SIGNATURES/CONFIDENTIALITY: You and/or your care partner have signed paperwork which will be entered into your electronic medical record.  These signatures attest to the fact that that the information above on your After Visit Summary has been reviewed and is understood.  Full responsibility of the confidentiality of this discharge information lies with you and/or your care-partner.  

## 2023-05-22 NOTE — Progress Notes (Signed)
Called to room to assist during endoscopic procedure.  Patient ID and intended procedure confirmed with present staff. Received instructions for my participation in the procedure from the performing physician.  

## 2023-05-23 ENCOUNTER — Telehealth: Payer: Self-pay | Admitting: *Deleted

## 2023-05-23 NOTE — Telephone Encounter (Signed)
Post procedure follow up call placed, no answer and left VM.  

## 2023-05-29 ENCOUNTER — Ambulatory Visit (INDEPENDENT_AMBULATORY_CARE_PROVIDER_SITE_OTHER): Payer: Medicare HMO | Admitting: Neurology

## 2023-05-29 ENCOUNTER — Encounter: Payer: Self-pay | Admitting: Internal Medicine

## 2023-05-29 DIAGNOSIS — E1165 Type 2 diabetes mellitus with hyperglycemia: Secondary | ICD-10-CM

## 2023-05-29 DIAGNOSIS — I10 Essential (primary) hypertension: Secondary | ICD-10-CM | POA: Diagnosis not present

## 2023-05-29 DIAGNOSIS — R7989 Other specified abnormal findings of blood chemistry: Secondary | ICD-10-CM | POA: Diagnosis not present

## 2023-05-29 DIAGNOSIS — E1169 Type 2 diabetes mellitus with other specified complication: Secondary | ICD-10-CM | POA: Diagnosis not present

## 2023-05-29 DIAGNOSIS — Z125 Encounter for screening for malignant neoplasm of prostate: Secondary | ICD-10-CM | POA: Diagnosis not present

## 2023-05-29 DIAGNOSIS — Z9989 Dependence on other enabling machines and devices: Secondary | ICD-10-CM

## 2023-05-29 DIAGNOSIS — G4733 Obstructive sleep apnea (adult) (pediatric): Secondary | ICD-10-CM | POA: Diagnosis not present

## 2023-05-29 DIAGNOSIS — E785 Hyperlipidemia, unspecified: Secondary | ICD-10-CM | POA: Diagnosis not present

## 2023-05-29 DIAGNOSIS — I119 Hypertensive heart disease without heart failure: Secondary | ICD-10-CM

## 2023-05-30 NOTE — Progress Notes (Signed)
[facility] BARRICK GLEASON 67 year old male 1955/12/20   HOME SLEEP TEST REPORT ( by Watch PAT)   STUDY DATE:  05-30-2023    ORDERING CLINICIAN: Melvyn Novas, MD  REFERRING CLINICIAN: Dr Felipa Eth   CLINICAL INFORMATION/HISTORY: Ronnie Brewer is a 67 y.o. male patient of Dr Avva's who is here for revisit 04/24/2023 for  CPAP follow up. His CPAP machine was set up in 2019. He sometimes still snores on CPAP. He is due for a new CPAP this year if his test confirms apnea, Dx hypoxemia and documented hypertensive heart disease, He carries a diagnosis of complex sleep apnea.   His apnea link test in 03-12-2018 was the last documented sleep test : Total Recording Time: 10 hours, 40 minutes. Total Apnea/Hypopnea Index (AHI):  21.8 /h, RDI was 25.1 /h. Average Oxygen Saturation:  89 %; Lowest Oxygen Desaturation: 79 %  Total Time Oxygen Saturation below 89% was 236 minutes!  Average Heart Rate: 49 bpm (between 43 and 122 bpm).    Epworth sleepiness score: 8 /24. FSS at 15/ 63 points  GDS 3/ 15 points.    BMI: 25 kg/m   Neck Circumference: 17.5   FINDINGS:   Sleep Summary:   Total Recording Time (hours, min): 7 hours 8 minutes  Total Sleep Time (hours, min): 6 hours 46 minutes               Percent REM (%):   24.2%    Sleep latency was 17 minutes but REM sleep latency 238 minutes.  Sleep was not fragmented.                                   Respiratory Indices by AASM criteria (CMS criteria):   Calculated pAHI (per hour):   12.7/h  (7.2/h)                          REM pAHI: 15.4/h (6.8/h)                                                NREM pAHI: 11.9/h (7.3/h)                            Positional AHI: The patient slept for the majority of the night in supine position associated with an AHI of 15.9/h (or 10.5/h following CMS criteria). Nonsupine sleep was associated with an AHI of 3.6 by CMS criteria.  Snoring threshold was barely reached the mean volume of  snoring was 41 dB and was present for 16.9% of the total recorded sleep time which would make this mild snoring.                                                 Oxygen Saturation Statistics:     O2 Saturation Range (%): Between the nadir at 88 and a maximum of 100 % with a mean saturation of 95%  O2 Saturation (minutes) <89%:   0.1-minute        Pulse Rate Statistics:   Pulse Mean (bpm): Only 47 bpm                Pulse Range:   Between 37 and 83 bpm  -these data only reflect the heart rate and not the heart rhythm.            IMPRESSION:  This HST confirms the presence of mild and obstructive sleep apnea with significant bradycardia, mild hypoxia and strong supine position dependency.   I would provide CPAP therapy at similar settings as currently used, He is using an AutoSet 10 by ResMed with a setting between 6 and 12 cm water pressure and 2 cm EPR,  but if this patient is no longer wanting to use CPAP he could reduce his AHI by avoiding sleeping in supine -and could therefore reduce this patient's apnea under 10 /h  and would allow to forego CPAP therapy.   RECOMMENDATION: He is using an AutoSet 10 by ResMed with a setting between 6 and 12 cm water pressure and 2 cm EPR, Avoiding sleeping in supine .    INTERPRETING PHYSICIAN:   Melvyn Novas, MD   [facility].

## 2023-06-03 NOTE — Telephone Encounter (Signed)
Contacted pt regarding SSR, LVM rq call back.

## 2023-06-03 NOTE — Procedures (Signed)
    [facility] Ronnie Brewer 67 year old male 12/10/1956   HOME SLEEP TEST REPORT ( by Watch PAT)   STUDY DATE:  05-30-2023    ORDERING CLINICIAN: Eura Radabaugh, MD  REFERRING CLINICIAN: Dr Avva   CLINICAL INFORMATION/HISTORY: Ronnie Brewer is a 67 y.o. male patient of Dr Avva's who is here for revisit 04/24/2023 for  CPAP follow up. His CPAP machine was set up in 2019. He sometimes still snores on CPAP. He is due for a new CPAP this year if his test confirms apnea, Dx hypoxemia and documented hypertensive heart disease, He carries a diagnosis of complex sleep apnea.   His apnea link test in 03-12-2018 was the last documented sleep test : Total Recording Time: 10 hours, 40 minutes. Total Apnea/Hypopnea Index (AHI):  21.8 /h, RDI was 25.1 /h. Average Oxygen Saturation:  89 %; Lowest Oxygen Desaturation: 79 %  Total Time Oxygen Saturation below 89% was 236 minutes!  Average Heart Rate: 49 bpm (between 43 and 122 bpm).    Epworth sleepiness score: 8 /24. FSS at 15/ 63 points  GDS 3/ 15 points.    BMI: 25 kg/m   Neck Circumference: 17.5   FINDINGS:   Sleep Summary:   Total Recording Time (hours, min): 7 hours 8 minutes  Total Sleep Time (hours, min): 6 hours 46 minutes               Percent REM (%):   24.2%    Sleep latency was 17 minutes but REM sleep latency 238 minutes.  Sleep was not fragmented.                                   Respiratory Indices by AASM criteria (CMS criteria):   Calculated pAHI (per hour):   12.7/h  (7.2/h)                          REM pAHI: 15.4/h (6.8/h)                                                NREM pAHI: 11.9/h (7.3/h)                            Positional AHI: The patient slept for the majority of the night in supine position associated with an AHI of 15.9/h (or 10.5/h following CMS criteria). Nonsupine sleep was associated with an AHI of 3.6 by CMS criteria.  Snoring threshold was barely reached the mean volume of  snoring was 41 dB and was present for 16.9% of the total recorded sleep time which would make this mild snoring.                                                 Oxygen Saturation Statistics:     O2 Saturation Range (%): Between the nadir at 88 and a maximum of 100 % with a mean saturation of 95%                                     O2 Saturation (minutes) <89%:   0.1-minute        Pulse Rate Statistics:   Pulse Mean (bpm): Only 47 bpm                Pulse Range:   Between 37 and 83 bpm  -these data only reflect the heart rate and not the heart rhythm.            IMPRESSION:  This HST confirms the presence of mild and obstructive sleep apnea with significant bradycardia, mild hypoxia and strong supine position dependency.   I would provide CPAP therapy at similar settings as currently used, He is using an AutoSet 10 by ResMed with a setting between 6 and 12 cm water pressure and 2 cm EPR,  but if this patient is no longer wanting to use CPAP he could reduce his AHI by avoiding sleeping in supine -and could therefore reduce this patient's apnea under 10 /h  and would allow to forego CPAP therapy.   RECOMMENDATION: He is using an AutoSet 10 by ResMed with a setting between 6 and 12 cm water pressure and 2 cm EPR, Avoiding sleeping in supine .    INTERPRETING PHYSICIAN:   Treasure Ingrum, MD   [facility].         

## 2023-06-04 ENCOUNTER — Telehealth: Payer: Self-pay

## 2023-06-04 NOTE — Telephone Encounter (Signed)
Addressed in TE 05/16/23

## 2023-06-04 NOTE — Telephone Encounter (Signed)
Pt returned call for nurse, please call pt back

## 2023-06-04 NOTE — Telephone Encounter (Signed)
Pt returned call, I advised pt that Dr. Vickey Huger reviewed their sleep study results and found that pt has mild OSA. Dr. Vickey Huger recommends that pt continue PAP. I reviewed PAP compliance expectations with the pt. Pt is agreeable to starting a CPAP. I advised pt that an order will be sent to current DME, [employer], and [employer] will call the pt within about one week after they file with the pt's insurance. [employer] will show the pt how to use the machine, fit for masks, and troubleshoot the CPAP if needed. A follow up appt was made for insurance purposes with [facility] 8/21 1245 verbalized understanding to arrive 15 minutes early and bring their CPAP. Pt verbalized understanding of results. Pt had no questions at this time but was encouraged to call back if questions arise. I have sent the order to [employer] and have received confirmation that they have received the order.

## 2023-06-04 NOTE — Telephone Encounter (Signed)
Called patient and LVM to return phone call

## 2023-06-05 DIAGNOSIS — F39 Unspecified mood [affective] disorder: Secondary | ICD-10-CM | POA: Diagnosis not present

## 2023-06-05 DIAGNOSIS — Z1339 Encounter for screening examination for other mental health and behavioral disorders: Secondary | ICD-10-CM | POA: Diagnosis not present

## 2023-06-05 DIAGNOSIS — I10 Essential (primary) hypertension: Secondary | ICD-10-CM | POA: Diagnosis not present

## 2023-06-05 DIAGNOSIS — Z1331 Encounter for screening for depression: Secondary | ICD-10-CM | POA: Diagnosis not present

## 2023-06-05 DIAGNOSIS — F5221 Male erectile disorder: Secondary | ICD-10-CM | POA: Diagnosis not present

## 2023-06-05 DIAGNOSIS — G4730 Sleep apnea, unspecified: Secondary | ICD-10-CM | POA: Diagnosis not present

## 2023-06-05 DIAGNOSIS — Z0000 Encounter for general adult medical examination without abnormal findings: Secondary | ICD-10-CM | POA: Diagnosis not present

## 2023-06-05 DIAGNOSIS — I340 Nonrheumatic mitral (valve) insufficiency: Secondary | ICD-10-CM | POA: Diagnosis not present

## 2023-06-05 DIAGNOSIS — R82998 Other abnormal findings in urine: Secondary | ICD-10-CM | POA: Diagnosis not present

## 2023-06-05 DIAGNOSIS — E785 Hyperlipidemia, unspecified: Secondary | ICD-10-CM | POA: Diagnosis not present

## 2023-06-05 DIAGNOSIS — Z800 Family history of malignant neoplasm of digestive organs: Secondary | ICD-10-CM | POA: Diagnosis not present

## 2023-06-05 DIAGNOSIS — C61 Malignant neoplasm of prostate: Secondary | ICD-10-CM | POA: Diagnosis not present

## 2023-06-05 DIAGNOSIS — E1169 Type 2 diabetes mellitus with other specified complication: Secondary | ICD-10-CM | POA: Diagnosis not present

## 2023-06-05 NOTE — Telephone Encounter (Signed)
New, Maryruth Bun, Abbe Amsterdam, CMA; Dinosaur, Nolen Mu, Ermalinda Barrios, [place]; Kathe Becton Received, thank you!       Previous Messages    ----- Message ----- From: Bobbye Morton, CMA Sent: 06/04/2023   3:42 PM EDT To: Henderson Newcomer; Melvern SampleElige Radon New; * Subject: AutoPAP                                        New orders have been placed for the above pt, DOB: 2056/07/23 Thanks

## 2023-06-06 ENCOUNTER — Telehealth: Payer: Self-pay

## 2023-06-19 DIAGNOSIS — C61 Malignant neoplasm of prostate: Secondary | ICD-10-CM | POA: Diagnosis not present

## 2023-06-25 DIAGNOSIS — C61 Malignant neoplasm of prostate: Secondary | ICD-10-CM | POA: Diagnosis not present

## 2023-07-01 DIAGNOSIS — N5201 Erectile dysfunction due to arterial insufficiency: Secondary | ICD-10-CM | POA: Diagnosis not present

## 2023-07-01 DIAGNOSIS — N486 Induration penis plastica: Secondary | ICD-10-CM | POA: Diagnosis not present

## 2023-07-01 DIAGNOSIS — C61 Malignant neoplasm of prostate: Secondary | ICD-10-CM | POA: Diagnosis not present

## 2023-07-01 DIAGNOSIS — N201 Calculus of ureter: Secondary | ICD-10-CM | POA: Diagnosis not present

## 2023-07-30 ENCOUNTER — Encounter: Payer: Medicare HMO | Admitting: Adult Health

## 2023-07-31 ENCOUNTER — Telehealth: Payer: Self-pay | Admitting: Anesthesiology

## 2023-07-31 ENCOUNTER — Encounter: Payer: Medicare HMO | Admitting: Adult Health

## 2023-07-31 NOTE — Progress Notes (Deleted)
[facility] [address] [place]. [place] [address] [phone]       OFFICE FOLLOW UP NOTE  [name] Date of Birth:  1956/10/24 Medical Record Number:  [id number]    Primary neurologist: Dr. Vickey Huger Reason for visit: Initial CPAP follow-up    SUBJECTIVE:   CHIEF COMPLAINT:  No chief complaint on file.  Follow-up visit:  Prior visit: 04/24/2023 with Dr. Vickey Huger  Brief HPI:   Ronnie Brewer is a 67 y.o. male who was initially evaluated by Dr. Vickey Huger in 02/2018 for longstanding history of sleep apnea on CPAP therapy.  He was due for new machine at that time.  Repeat HST 03/2018 confirmed moderate and mostly obstructive obstructive apnea with some central apneas.  Received new CPAP machine 04/2018.  At last visit, CPAP machine now 68 years old and due for new machine.  Repeat HST 05/29/2023 confirmed mild obstructive sleep apnea with total AHI 12.7/h with significant bradycardia (HR 37-83), and strong supine position dependency.  Recommended continuation with CPAP.         ROS:   14 system review of systems performed and negative with exception of those listed in HPI  PMH:  Past Medical History:  Diagnosis Date   Anxiety    Chronic kidney disease    hx kidney stones- 2014   Diabetes mellitus without complication (HCC)    High cholesterol    Hypertension    Sleep apnea    wears CPAP    PSH:  Past Surgical History:  Procedure Laterality Date   arm surgery     1981   COLONOSCOPY      Social History:  Social History   Socioeconomic History   Marital status: Married    Spouse name: Not on file   Number of children: Not on file   Years of education: Not on file   Highest education level: Not on file  Occupational History    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  Tobacco Use   Smoking status: Never   Smokeless tobacco: Never  Vaping Use   Vaping status: Never Used  Substance and Sexual Activity   Alcohol use: No    Drug use: No   Sexual activity: Not on file  Other Topics Concern   Not on file  Social History Narrative   Not on file   Social Determinants of Health   Financial Resource Strain: Not on file  Food Insecurity: Not on file  Transportation Needs: Not on file  Physical Activity: Not on file  Stress: Not on file  Social Connections: Not on file  Intimate Partner Violence: Not on file    Family History:  Family History  Problem Relation Age of Onset   Colon cancer Mother    Hypertension Mother    Hypertension Father    Nephrolithiasis Father    Ulcers Father    Diabetes Mellitus II Father    Esophageal cancer Neg Hx    Rectal cancer Neg Hx    Stomach cancer Neg Hx     Medications:   Current Outpatient Medications on File Prior to Visit  Medication Sig Dispense Refill   aspirin EC 81 MG tablet Take 81 mg by mouth daily.     atorvastatin (LIPITOR) 80 MG tablet Take 80 mg by mouth daily.     JANUVIA 100 MG tablet Take 100 mg by mouth daily.     metFORMIN (GLUCOPHAGE) 500 MG tablet Take 500 mg by mouth in the morning, at  noon, and at bedtime.     Multiple Vitamin (MULTIVITAMIN WITH MINERALS) TABS Take 1 tablet by mouth daily.     PARoxetine (PAXIL) 20 MG tablet Take 20 mg by mouth every morning.     telmisartan (MICARDIS) 80 MG tablet Take 40 mg by mouth daily.   6   No current facility-administered medications on file prior to visit.    Allergies:   Allergies  Allergen Reactions   Codeine Nausea And Vomiting      OBJECTIVE:  Physical Exam  There were no vitals filed for this visit. There is no height or weight on file to calculate BMI. No results found.   General: well developed, well nourished, seated, in no evident distress Head: head normocephalic and atraumatic.   Neck: supple with no carotid or supraclavicular bruits Cardiovascular: regular rate and rhythm, no murmurs Musculoskeletal: no deformity Skin:  no rash/petichiae Vascular:  Normal pulses all  extremities   Neurologic Exam Mental Status: Awake and fully alert. Oriented to place and time. Recent and remote memory intact. Attention span, concentration and fund of knowledge appropriate. Mood and affect appropriate.  Cranial Nerves: Pupils equal, briskly reactive to light. Extraocular movements full without nystagmus. Visual fields full to confrontation. Hearing intact. Facial sensation intact. Face, tongue, palate moves normally and symmetrically.  Motor: Normal bulk and tone. Normal strength in all tested extremity muscles Sensory.: intact to touch , pinprick , position and vibratory sensation.  Coordination: Rapid alternating movements normal in all extremities. Finger-to-nose and heel-to-shin performed accurately bilaterally. Gait and Station: Arises from chair without difficulty. Stance is normal. Gait demonstrates normal stride length and balance without use of AD. Tandem walk and heel toe without difficulty.  Reflexes: 1+ and symmetric. Toes downgoing.         ASSESSMENT/PLAN: Ronnie Brewer is a 67 y.o. year old male    OSA on CPAP : Compliance report shows satisfactory usage with optimal residual AHI.  Discussed continued nightly usage with ensuring greater than 4 hours nightly for optimal benefit and per insurance purposes.  Continue to follow with DME company for any needed supplies or CPAP related concerns     Follow up in *** or call earlier if needed   CC:  PCP: Avva, Ravisankar, MD    I spent *** minutes of face-to-face and non-face-to-face time with patient.  This included previsit chart review, lab review, study review, order entry, electronic health record documentation, patient education regarding diagnosis of sleep apnea with review and discussion of compliance report and answered all other questions to patient's satisfaction   Ihor Austin, [facility]  [facility] [address] [place], [place] [address]  Phone  [phone] Fax [phone] Note: This document was prepared with digital dictation and possible smart phrase technology. Any transcriptional errors that result from this process are unintentional.

## 2023-07-31 NOTE — Telephone Encounter (Signed)
Left message for pt to return call.   ** when pt returns call please ask if he has started using his new CPAP machine yet, if he has not then appointment today is not needed as it was scheduled for initial CPAP compliance**

## 2023-09-26 ENCOUNTER — Other Ambulatory Visit: Payer: Self-pay | Admitting: Neurology

## 2023-09-26 DIAGNOSIS — Z9989 Dependence on other enabling machines and devices: Secondary | ICD-10-CM

## 2023-09-26 DIAGNOSIS — I119 Hypertensive heart disease without heart failure: Secondary | ICD-10-CM

## 2023-09-26 DIAGNOSIS — G4739 Other sleep apnea: Secondary | ICD-10-CM

## 2023-09-26 DIAGNOSIS — G4733 Obstructive sleep apnea (adult) (pediatric): Secondary | ICD-10-CM

## 2023-09-30 ENCOUNTER — Telehealth: Payer: Self-pay | Admitting: Anesthesiology

## 2023-10-08 DIAGNOSIS — G4733 Obstructive sleep apnea (adult) (pediatric): Secondary | ICD-10-CM | POA: Diagnosis not present

## 2023-10-16 DIAGNOSIS — F39 Unspecified mood [affective] disorder: Secondary | ICD-10-CM | POA: Diagnosis not present

## 2023-10-16 DIAGNOSIS — G4730 Sleep apnea, unspecified: Secondary | ICD-10-CM | POA: Diagnosis not present

## 2023-10-16 DIAGNOSIS — N200 Calculus of kidney: Secondary | ICD-10-CM | POA: Diagnosis not present

## 2023-10-16 DIAGNOSIS — I10 Essential (primary) hypertension: Secondary | ICD-10-CM | POA: Diagnosis not present

## 2023-10-16 DIAGNOSIS — I340 Nonrheumatic mitral (valve) insufficiency: Secondary | ICD-10-CM | POA: Diagnosis not present

## 2023-10-16 DIAGNOSIS — E1169 Type 2 diabetes mellitus with other specified complication: Secondary | ICD-10-CM | POA: Diagnosis not present

## 2023-10-16 DIAGNOSIS — C61 Malignant neoplasm of prostate: Secondary | ICD-10-CM | POA: Diagnosis not present

## 2023-10-16 DIAGNOSIS — Z800 Family history of malignant neoplasm of digestive organs: Secondary | ICD-10-CM | POA: Diagnosis not present

## 2023-10-16 DIAGNOSIS — E785 Hyperlipidemia, unspecified: Secondary | ICD-10-CM | POA: Diagnosis not present

## 2023-10-16 DIAGNOSIS — F5221 Male erectile disorder: Secondary | ICD-10-CM | POA: Diagnosis not present

## 2023-10-21 DIAGNOSIS — L308 Other specified dermatitis: Secondary | ICD-10-CM | POA: Diagnosis not present

## 2023-10-21 DIAGNOSIS — R21 Rash and other nonspecific skin eruption: Secondary | ICD-10-CM | POA: Diagnosis not present

## 2023-10-21 DIAGNOSIS — L821 Other seborrheic keratosis: Secondary | ICD-10-CM | POA: Diagnosis not present

## 2023-10-21 DIAGNOSIS — L578 Other skin changes due to chronic exposure to nonionizing radiation: Secondary | ICD-10-CM | POA: Diagnosis not present

## 2023-10-21 DIAGNOSIS — L570 Actinic keratosis: Secondary | ICD-10-CM | POA: Diagnosis not present

## 2023-10-21 DIAGNOSIS — D1801 Hemangioma of skin and subcutaneous tissue: Secondary | ICD-10-CM | POA: Diagnosis not present

## 2023-11-08 DIAGNOSIS — G4733 Obstructive sleep apnea (adult) (pediatric): Secondary | ICD-10-CM | POA: Diagnosis not present

## 2023-11-09 ENCOUNTER — Encounter: Payer: Self-pay | Admitting: Ophthalmology

## 2023-11-09 MED ORDER — POLYMYXIN B-TRIMETHOPRIM 10000-0.1 UNIT/ML-% OP SOLN: 1.0000 [drp] | OPHTHALMIC | 0 refills | Status: AC

## 2023-11-13 DIAGNOSIS — H10503 Unspecified blepharoconjunctivitis, bilateral: Secondary | ICD-10-CM | POA: Diagnosis not present

## 2023-11-20 DIAGNOSIS — H2513 Age-related nuclear cataract, bilateral: Secondary | ICD-10-CM | POA: Diagnosis not present

## 2023-11-20 DIAGNOSIS — H04123 Dry eye syndrome of bilateral lacrimal glands: Secondary | ICD-10-CM | POA: Diagnosis not present

## 2023-11-20 DIAGNOSIS — H10503 Unspecified blepharoconjunctivitis, bilateral: Secondary | ICD-10-CM | POA: Diagnosis not present

## 2023-12-08 DIAGNOSIS — G4733 Obstructive sleep apnea (adult) (pediatric): Secondary | ICD-10-CM | POA: Diagnosis not present

## 2023-12-10 NOTE — Progress Notes (Signed)
 [facility] [address] [place]. [place] [address] [phone]       OFFICE FOLLOW UP NOTE  [name] Date of Birth:  November 28, 1956 Medical Record Number:  [id number]    Primary neurologist: Dr. Chalice Reason for visit: Initial CPAP follow-up    SUBJECTIVE:   CHIEF COMPLAINT:  Chief Complaint  Patient presents with   Follow-up    Patient in room #3 and alone. Patient states he is doing great with his CPAP machine and sleeps well at night.    Follow-up visit:   Brief HPI:   Ronnie Brewer is a 67 y.o. male who is followed for OSA on CPAP.  Initially diagnosed with sleep apnea in 2019 and placed on CPAP therapy.    He was last seen by Dr. Chalice on 04/24/2023, due for new CPAP machine, order placed for repeat sleep study.  Repeat HST 05/2023 confirmed presence of mild OSA with total AHI 12.7/h associated with significant bradycardia, mild hypoxia and strong supine position dependency.  Recommended continuation of autoPAP therapy, received new autoPAP 09/2023.   Interval history:  CPAP compliance report shows excellent usage and optimal residual AHI.  Tolerating new CPAP well.  Sleeps well at night and reports adequate daytime energy levels. ESS 8/24.  Follows with DME [employer]. No questions or concerns today.          ROS:   14 system review of systems performed and negative with exception of those listed in HPI  PMH:  Past Medical History:  Diagnosis Date   Anxiety    Chronic kidney disease    hx kidney stones- 2014   Diabetes mellitus without complication (HCC)    High cholesterol    Hypertension    Sleep apnea    wears CPAP    PSH:  Past Surgical History:  Procedure Laterality Date   arm surgery     1981   COLONOSCOPY      Social History:  Social History   Socioeconomic History   Marital status: Married    Spouse name: Not on file   Number of children: Not on file   Years of education: Not on file    Highest education level: Not on file  Occupational History    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  Tobacco Use   Smoking status: Never   Smokeless tobacco: Never  Vaping Use   Vaping status: Never Used  Substance and Sexual Activity   Alcohol  use: No   Drug use: No   Sexual activity: Not on file  Other Topics Concern   Not on file  Social History Narrative   Not on file   Social Drivers of Health   Financial Resource Strain: Not on file  Food Insecurity: Not on file  Transportation Needs: Not on file  Physical Activity: Not on file  Stress: Not on file  Social Connections: Not on file  Intimate Partner Violence: Not on file    Family History:  Family History  Problem Relation Age of Onset   Colon cancer Mother    Hypertension Mother    Hypertension Father    Nephrolithiasis Father    Ulcers Father    Diabetes Mellitus II Father    Esophageal cancer Neg Hx    Rectal cancer Neg Hx    Stomach cancer Neg Hx     Medications:   Current Outpatient Medications on File Prior to Visit  Medication Sig Dispense Refill   aspirin EC 81 MG  tablet Take 81 mg by mouth daily.     atorvastatin (LIPITOR) 80 MG tablet Take 80 mg by mouth daily.     JANUVIA 100 MG tablet Take 100 mg by mouth daily.     metFORMIN (GLUCOPHAGE) 500 MG tablet Take 500 mg by mouth in the morning, at noon, and at bedtime.     Multiple Vitamin (MULTIVITAMIN WITH MINERALS) TABS Take 1 tablet by mouth daily.     PARoxetine (PAXIL) 20 MG tablet Take 20 mg by mouth every morning.     telmisartan (MICARDIS) 80 MG tablet Take 40 mg by mouth daily.   6   No current facility-administered medications on file prior to visit.    Allergies:   Allergies  Allergen Reactions   Codeine Nausea And Vomiting   Colesevelam Other (See Comments)      OBJECTIVE:  Physical Exam  Vitals:   12/12/23 0920  BP: 105/60  Pulse: (!) 53  Weight: 184 lb (83.5 kg)  Height: 6' (1.829 m)   Body mass index is 24.95  kg/m. No results found.   General: well developed, well nourished, very pleasant middle-age male, seated, in no evident distress Head: head normocephalic and atraumatic.   Neck: supple with no carotid or supraclavicular bruits Cardiovascular: regular rate and rhythm, no murmurs  Neurologic Exam Mental Status: Awake and fully alert. Oriented to place and time. Recent and remote memory intact. Attention span, concentration and fund of knowledge appropriate. Mood and affect appropriate.  Cranial Nerves: Pupils equal, briskly reactive to light. Extraocular movements full without nystagmus. Visual fields full to confrontation. Hearing intact. Facial sensation intact. Face, tongue, palate moves normally and symmetrically.  Motor: Normal bulk and tone. Normal strength in all tested extremity muscles Gait and Station: Arises from chair without difficulty. Stance is normal. Gait demonstrates normal stride length and balance without use of AD.  Reflexes: 1+ and symmetric. Toes downgoing.         ASSESSMENT/PLAN: Ronnie Brewer is a 67 y.o. year old male    OSA on CPAP : Compliance report shows satisfactory usage with optimal residual AHI.  Continue current pressure settings.  Discussed continued nightly usage with ensuring greater than 4 hours nightly for optimal benefit and per insurance purposes.  Continue to follow with DME company for any needed supplies or CPAP related concerns     Follow up in 1 year or call earlier if needed   CC:  PCP: Avva, Ravisankar, MD    I spent 25 minutes of face-to-face and non-face-to-face time with patient.  This included previsit chart review, lab review, study review, order entry, electronic health record documentation, patient education and discussion regarding above diagnoses and treatment plan and answered all other questions to patient's satisfaction  Harlene Bogaert, [facility]  [facility] [address] [place], [place] [address]  Phone [phone] Fax [phone] Note: This document was prepared with digital dictation and possible smart phrase technology. Any transcriptional errors that result from this process are unintentional.

## 2023-12-12 ENCOUNTER — Encounter: Payer: Self-pay | Admitting: Adult Health

## 2023-12-12 ENCOUNTER — Ambulatory Visit (INDEPENDENT_AMBULATORY_CARE_PROVIDER_SITE_OTHER): Payer: PPO | Admitting: Adult Health

## 2023-12-12 VITALS — BP 105/60 | HR 53 | Ht 72.0 in | Wt 184.0 lb

## 2023-12-12 DIAGNOSIS — G4733 Obstructive sleep apnea (adult) (pediatric): Secondary | ICD-10-CM

## 2023-12-12 NOTE — Patient Instructions (Signed)
 Your Plan:  Continue nightly use of CPAP for adequate sleep apnea management  Continue to follow with D[employer] for any needed supplies or CPAP related concerns     Follow-up in 1 year or call earlier if needed     Thank you for coming to see us  at [facility]. I hope we have been able to provide you high quality care today.  You may receive a patient satisfaction survey over the next few weeks. We would appreciate your feedback and comments so that we may continue to improve ourselves and the health of our patients.

## 2023-12-16 ENCOUNTER — Telehealth: Payer: Self-pay

## 2024-02-25 DIAGNOSIS — F39 Unspecified mood [affective] disorder: Secondary | ICD-10-CM | POA: Diagnosis not present

## 2024-02-25 DIAGNOSIS — E785 Hyperlipidemia, unspecified: Secondary | ICD-10-CM | POA: Diagnosis not present

## 2024-02-25 DIAGNOSIS — N200 Calculus of kidney: Secondary | ICD-10-CM | POA: Diagnosis not present

## 2024-02-25 DIAGNOSIS — I340 Nonrheumatic mitral (valve) insufficiency: Secondary | ICD-10-CM | POA: Diagnosis not present

## 2024-02-25 DIAGNOSIS — I10 Essential (primary) hypertension: Secondary | ICD-10-CM | POA: Diagnosis not present

## 2024-02-25 DIAGNOSIS — C61 Malignant neoplasm of prostate: Secondary | ICD-10-CM | POA: Diagnosis not present

## 2024-02-25 DIAGNOSIS — F5221 Male erectile disorder: Secondary | ICD-10-CM | POA: Diagnosis not present

## 2024-02-25 DIAGNOSIS — G4730 Sleep apnea, unspecified: Secondary | ICD-10-CM | POA: Diagnosis not present

## 2024-02-25 DIAGNOSIS — Z800 Family history of malignant neoplasm of digestive organs: Secondary | ICD-10-CM | POA: Diagnosis not present

## 2024-02-25 DIAGNOSIS — E1169 Type 2 diabetes mellitus with other specified complication: Secondary | ICD-10-CM | POA: Diagnosis not present

## 2024-03-19 DIAGNOSIS — G4733 Obstructive sleep apnea (adult) (pediatric): Secondary | ICD-10-CM | POA: Diagnosis not present

## 2024-03-20 DIAGNOSIS — G4733 Obstructive sleep apnea (adult) (pediatric): Secondary | ICD-10-CM | POA: Diagnosis not present

## 2024-04-07 DIAGNOSIS — C61 Malignant neoplasm of prostate: Secondary | ICD-10-CM | POA: Diagnosis not present

## 2024-04-07 DIAGNOSIS — N201 Calculus of ureter: Secondary | ICD-10-CM | POA: Diagnosis not present

## 2024-04-07 DIAGNOSIS — N486 Induration penis plastica: Secondary | ICD-10-CM | POA: Diagnosis not present

## 2024-04-07 DIAGNOSIS — N5201 Erectile dysfunction due to arterial insufficiency: Secondary | ICD-10-CM | POA: Diagnosis not present

## 2024-04-19 DIAGNOSIS — G4733 Obstructive sleep apnea (adult) (pediatric): Secondary | ICD-10-CM | POA: Diagnosis not present

## 2024-05-20 DIAGNOSIS — G4733 Obstructive sleep apnea (adult) (pediatric): Secondary | ICD-10-CM | POA: Diagnosis not present

## 2024-06-19 DIAGNOSIS — I10 Essential (primary) hypertension: Secondary | ICD-10-CM | POA: Diagnosis not present

## 2024-06-19 DIAGNOSIS — C61 Malignant neoplasm of prostate: Secondary | ICD-10-CM | POA: Diagnosis not present

## 2024-06-19 DIAGNOSIS — E1169 Type 2 diabetes mellitus with other specified complication: Secondary | ICD-10-CM | POA: Diagnosis not present

## 2024-06-19 DIAGNOSIS — E7849 Other hyperlipidemia: Secondary | ICD-10-CM | POA: Diagnosis not present

## 2024-06-19 DIAGNOSIS — Z1212 Encounter for screening for malignant neoplasm of rectum: Secondary | ICD-10-CM | POA: Diagnosis not present

## 2024-06-19 DIAGNOSIS — G4733 Obstructive sleep apnea (adult) (pediatric): Secondary | ICD-10-CM | POA: Diagnosis not present

## 2024-06-19 DIAGNOSIS — E785 Hyperlipidemia, unspecified: Secondary | ICD-10-CM | POA: Diagnosis not present

## 2024-06-26 DIAGNOSIS — Z1339 Encounter for screening examination for other mental health and behavioral disorders: Secondary | ICD-10-CM | POA: Diagnosis not present

## 2024-06-26 DIAGNOSIS — I340 Nonrheumatic mitral (valve) insufficiency: Secondary | ICD-10-CM | POA: Diagnosis not present

## 2024-06-26 DIAGNOSIS — Z0000 Encounter for general adult medical examination without abnormal findings: Secondary | ICD-10-CM | POA: Diagnosis not present

## 2024-06-26 DIAGNOSIS — Z1331 Encounter for screening for depression: Secondary | ICD-10-CM | POA: Diagnosis not present

## 2024-06-26 DIAGNOSIS — E1169 Type 2 diabetes mellitus with other specified complication: Secondary | ICD-10-CM | POA: Diagnosis not present

## 2024-06-26 DIAGNOSIS — F5221 Male erectile disorder: Secondary | ICD-10-CM | POA: Diagnosis not present

## 2024-06-26 DIAGNOSIS — E785 Hyperlipidemia, unspecified: Secondary | ICD-10-CM | POA: Diagnosis not present

## 2024-06-26 DIAGNOSIS — Z800 Family history of malignant neoplasm of digestive organs: Secondary | ICD-10-CM | POA: Diagnosis not present

## 2024-06-26 DIAGNOSIS — R82998 Other abnormal findings in urine: Secondary | ICD-10-CM | POA: Diagnosis not present

## 2024-06-26 DIAGNOSIS — I10 Essential (primary) hypertension: Secondary | ICD-10-CM | POA: Diagnosis not present

## 2024-06-26 DIAGNOSIS — G4730 Sleep apnea, unspecified: Secondary | ICD-10-CM | POA: Diagnosis not present

## 2024-06-26 DIAGNOSIS — C61 Malignant neoplasm of prostate: Secondary | ICD-10-CM | POA: Diagnosis not present

## 2024-06-26 DIAGNOSIS — F39 Unspecified mood [affective] disorder: Secondary | ICD-10-CM | POA: Diagnosis not present

## 2024-07-20 DIAGNOSIS — G4733 Obstructive sleep apnea (adult) (pediatric): Secondary | ICD-10-CM | POA: Diagnosis not present

## 2024-08-04 ENCOUNTER — Other Ambulatory Visit: Payer: Self-pay | Admitting: Urology

## 2024-08-04 DIAGNOSIS — C61 Malignant neoplasm of prostate: Secondary | ICD-10-CM

## 2024-08-20 DIAGNOSIS — G4733 Obstructive sleep apnea (adult) (pediatric): Secondary | ICD-10-CM | POA: Diagnosis not present

## 2024-09-14 ENCOUNTER — Ambulatory Visit
Admission: RE | Admit: 2024-09-14 | Discharge: 2024-09-14 | Disposition: A | Discharge status: Home / Self Care | Source: Ambulatory Visit | Attending: Urology | Admitting: Urology

## 2024-09-14 DIAGNOSIS — C61 Malignant neoplasm of prostate: Secondary | ICD-10-CM

## 2024-09-14 MED ORDER — GADOPICLENOL 0.5 MMOL/ML IV SOLN
9.0000 mL | Freq: Once | INTRAVENOUS | Status: AC | PRN
  Administered 2024-09-14: 9 mL via INTRAVENOUS

## 2024-09-19 DIAGNOSIS — G4733 Obstructive sleep apnea (adult) (pediatric): Secondary | ICD-10-CM | POA: Diagnosis not present

## 2024-09-29 DIAGNOSIS — C61 Malignant neoplasm of prostate: Secondary | ICD-10-CM | POA: Diagnosis not present

## 2024-10-05 DIAGNOSIS — Z87442 Personal history of urinary calculi: Secondary | ICD-10-CM | POA: Diagnosis not present

## 2024-10-05 DIAGNOSIS — C61 Malignant neoplasm of prostate: Secondary | ICD-10-CM | POA: Diagnosis not present

## 2024-10-05 DIAGNOSIS — N486 Induration penis plastica: Secondary | ICD-10-CM | POA: Diagnosis not present

## 2024-10-05 DIAGNOSIS — R399 Unspecified symptoms and signs involving the genitourinary system: Secondary | ICD-10-CM | POA: Diagnosis not present

## 2024-10-19 DIAGNOSIS — L821 Other seborrheic keratosis: Secondary | ICD-10-CM | POA: Diagnosis not present

## 2024-10-19 DIAGNOSIS — L578 Other skin changes due to chronic exposure to nonionizing radiation: Secondary | ICD-10-CM | POA: Diagnosis not present

## 2024-10-19 DIAGNOSIS — W908XXS Exposure to other nonionizing radiation, sequela: Secondary | ICD-10-CM | POA: Diagnosis not present

## 2024-10-19 DIAGNOSIS — L281 Prurigo nodularis: Secondary | ICD-10-CM | POA: Diagnosis not present

## 2024-10-19 DIAGNOSIS — D1801 Hemangioma of skin and subcutaneous tissue: Secondary | ICD-10-CM | POA: Diagnosis not present

## 2024-10-19 DIAGNOSIS — L570 Actinic keratosis: Secondary | ICD-10-CM | POA: Diagnosis not present

## 2024-10-28 ENCOUNTER — Ambulatory Visit: Admitting: Radiation Oncology

## 2024-10-28 ENCOUNTER — Ambulatory Visit

## 2024-11-02 NOTE — Progress Notes (Signed)
 GU Location of Tumor / Histology: Prostate Ca  If Prostate Cancer, Gleason Score is (3 + 4) and PSA is (10.8 on 09/30/2024)  Ronnie Brewer presented as referral from Dr. Ricardo Likens [facility]) elevated PSA.  Biopsies       09/14/2024 Dr. Ricardo Likens MR Prostate with/without Contrast CLINICAL DATA:  PSA:  7.38 on 04/08/2024; Gleason 3+3=6 prostate adenocarcinoma.  IMPRESSION: 1. Region of interest #1: PI-RADS 5 lesion (1.68 cc) in the left anterior transition zone, left anterior fibromuscular stroma, and left anterior peripheral zone (base and mid gland), corresponding to previous region of interest #1. 2. Region of interest #2: PI-RADS 3 lesion (0.25 cc) in the right anterior and right posterolateral peripheral zone at the apex, corresponding to previous region of interest #2, although smaller than previous. 3. Targeting data sent to the [facility] system.  Past/Anticipated interventions by urology, if any:  Dr. Ricardo Likens   Past/Anticipated interventions by medical oncology, if any:  NA  Weight changes, if any:  No  IPSS:  3 SHIM:  11  Bowel/Bladder complaints, if any:  No  Nausea/Vomiting, if any: No  Pain issues, if any:  0/10  SAFETY ISSUES: Prior radiation?  No Pacemaker/ICD? No Possible current pregnancy? Male Is the patient on methotrexate? No  Current Complaints / other details: None  30 minutes spent total, including time for meaningful use questions, reviewing medication, as well as spent in face-to-face time in nurse evaluation with the patient.

## 2024-11-09 ENCOUNTER — Ambulatory Visit
Admission: RE | Admit: 2024-11-09 | Discharge: 2024-11-09 | Disposition: A | Source: Ambulatory Visit | Attending: Radiation Oncology

## 2024-11-09 ENCOUNTER — Encounter: Payer: Self-pay | Admitting: Radiation Oncology

## 2024-11-09 ENCOUNTER — Ambulatory Visit
Admission: RE | Admit: 2024-11-09 | Discharge: 2024-11-09 | Disposition: A | Discharge status: Home / Self Care | Source: Ambulatory Visit | Attending: Radiation Oncology | Admitting: Radiation Oncology

## 2024-11-09 VITALS — BP 132/81 | HR 63 | Temp 97.8°F | Resp 18 | Ht 72.0 in | Wt 180.0 lb

## 2024-11-09 DIAGNOSIS — C61 Malignant neoplasm of prostate: Secondary | ICD-10-CM | POA: Insufficient documentation

## 2024-11-09 DIAGNOSIS — E119 Type 2 diabetes mellitus without complications: Secondary | ICD-10-CM | POA: Diagnosis not present

## 2024-11-09 DIAGNOSIS — Z191 Hormone sensitive malignancy status: Secondary | ICD-10-CM | POA: Diagnosis not present

## 2024-11-09 DIAGNOSIS — Z800 Family history of malignant neoplasm of digestive organs: Secondary | ICD-10-CM | POA: Diagnosis not present

## 2024-11-09 DIAGNOSIS — Z7982 Long term (current) use of aspirin: Secondary | ICD-10-CM | POA: Insufficient documentation

## 2024-11-09 DIAGNOSIS — I10 Essential (primary) hypertension: Secondary | ICD-10-CM | POA: Diagnosis not present

## 2024-11-09 DIAGNOSIS — G4730 Sleep apnea, unspecified: Secondary | ICD-10-CM | POA: Diagnosis not present

## 2024-11-09 DIAGNOSIS — Z79899 Other long term (current) drug therapy: Secondary | ICD-10-CM | POA: Insufficient documentation

## 2024-11-09 DIAGNOSIS — Z7984 Long term (current) use of oral hypoglycemic drugs: Secondary | ICD-10-CM | POA: Diagnosis not present

## 2024-11-09 DIAGNOSIS — E7800 Pure hypercholesterolemia, unspecified: Secondary | ICD-10-CM | POA: Diagnosis not present

## 2024-11-09 DIAGNOSIS — N189 Chronic kidney disease, unspecified: Secondary | ICD-10-CM | POA: Insufficient documentation

## 2024-11-09 HISTORY — DX: Elevated prostate specific antigen (PSA): R97.20

## 2024-11-09 HISTORY — DX: Malignant neoplasm of prostate: C61

## 2024-11-09 NOTE — Progress Notes (Signed)
 Introduced myself to the patient, his wife, and daughters, as the prostate nurse navigator. He is here to discuss his radiation treatment options. I provided patient with some written education and my direct contact information.  Patient knows to reach out with any questions or barriers that may arise.

## 2024-11-09 NOTE — Progress Notes (Signed)
 Radiation Oncology         [phone] ________________________________  Initial Outpatient Consultation  Name: Ronnie Brewer MRN: [id number]  Date: 11/09/2024  DOB: 1956-01-16  RR:Jccj, Ravisankar, MD  Ronnie Ricardo KATHEE Mickey., *   REFERRING PHYSICIAN: Alvaro Ricardo KATHEE Mickey., *  DIAGNOSIS: 68 y.o. gentleman with Stage T1c adenocarcinoma of the prostate with Gleason score of 3+4, and PSA of 10.8.    ICD-10-CM   1. Malignant neoplasm of prostate (HCC)  C61       HISTORY OF PRESENT ILLNESS: Ronnie Brewer is a 68 y.o. male with a diagnosis of prostate cancer. He has been followed by Dr. Alvaro in urology for several years for a history of an elevated PSA. He was initially diagnosed with low volume Gleason 3+3 prostate cancer involving 1 of 12 cores on biopsy in 11/2020 and was placed on active surveillance. His PSA fluctuated but was 5.97 in 02/2022 and a surveillance prostate MRI was performed on 03/05/2022 that showed a PI-RADS 5 lesion in the left anterior prostate and a PI-RADS 3 lesion in the right peripheral zone. His PSA increased to 8.14 in 06/2023. A subsequent MRI fusion biopsy in 06/2023 showed low volume disease involving 4 of 14 cores but some disease progression with Gleason 3+4 in 1 of 4 positive cores.  The other 3 were Gleason 3+3 so he elected to continue active surveillance. Since that time, his PSA has continued to fluctuate but overall increasing, with a PSA of 7.38 in 03/2024 and up to 10.8 on 09/29/24. He underwent another surveillance prostate MRI on 09/14/24 showing a PI-RADS 5 lesion in the left anterior transition zone, fibromuscular stroma, and peripheral zone (base and mid gland), corresponding to previous ROI #1 as well as a PI-RADS 3 lesion in the right anterior and posterolateral peripheral zone at apex, corresponding to previous ROI #2, although smaller than previous.   The patient reviewed the biopsy, imaging and lab results with his urologist and given the rise in PSA,  now above 10, he has kindly been referred today for discussion of potential definitive radiation treatment options. He is accompanied by his wife and 2 daughters for today's visit.   PREVIOUS RADIATION THERAPY: No  PAST MEDICAL HISTORY:  Past Medical History:  Diagnosis Date   Anxiety    Chronic kidney disease    hx kidney stones- 2014   Diabetes mellitus without complication (HCC)    Elevated PSA    High cholesterol    Hypertension    Prostate cancer (HCC)    Sleep apnea    wears CPAP      PAST SURGICAL HISTORY: Past Surgical History:  Procedure Laterality Date   arm surgery     1981   COLONOSCOPY     PROSTATE BIOPSY      FAMILY HISTORY:  Family History  Problem Relation Age of Onset   Colon cancer Mother    Hypertension Mother    Hypertension Father    Nephrolithiasis Father    Ulcers Father    Diabetes Mellitus II Father    Esophageal cancer Neg Hx    Rectal cancer Neg Hx    Stomach cancer Neg Hx     SOCIAL HISTORY:  Social History   Socioeconomic History   Marital status: Married    Spouse name: Not on file   Number of children: Not on file   Years of education: Not on file   Highest education level: Not on file  Occupational History  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  Tobacco Use   Smoking status: Never   Smokeless tobacco: Never  Vaping Use   Vaping status: Never Used  Substance and Sexual Activity   Alcohol  use: No   Drug use: No   Sexual activity: Not Currently  Other Topics Concern   Not on file  Social History Narrative   Not on file   Social Drivers of Health   Financial Resource Strain: Not on file  Food Insecurity: No Food Insecurity (11/09/2024)   Hunger Vital Sign    Worried About Running Out of Food in the Last Year: Never true    Ran Out of Food in the Last Year: Never true  Transportation Needs: No Transportation Needs (11/09/2024)   PRAPARE - Administrator, Civil Service (Medical): No    Lack of  Transportation (Non-Medical): No  Physical Activity: Not on file  Stress: Not on file  Social Connections: Not on file  Intimate Partner Violence: Not At Risk (11/09/2024)   Humiliation, Afraid, Rape, and Kick questionnaire    Fear of Current or Ex-Partner: No    Emotionally Abused: No    Physically Abused: No    Sexually Abused: No    ALLERGIES: Codeine and Colesevelam  MEDICATIONS:  Current Outpatient Medications  Medication Sig Dispense Refill   FLUZONE HIGH-DOSE 0.5 ML injection      HYDROcodone-acetaminophen  (NORCO/VICODIN) 5-325 MG tablet Take 1-2 tablets by mouth every 6 (six) hours as needed for severe pain (pain score 7-10).     aspirin EC 81 MG tablet Take 81 mg by mouth daily.     atorvastatin (LIPITOR) 80 MG tablet Take 80 mg by mouth daily.     JANUVIA 100 MG tablet Take 100 mg by mouth daily.     JARDIANCE 10 MG TABS tablet Take 10 mg by mouth daily.     metFORMIN (GLUCOPHAGE) 500 MG tablet Take 500 mg by mouth in the morning, at noon, and at bedtime.     Multiple Vitamin (MULTIVITAMIN WITH MINERALS) TABS Take 1 tablet by mouth daily.     PARoxetine (PAXIL) 20 MG tablet Take 20 mg by mouth every morning.     telmisartan (MICARDIS) 80 MG tablet Take 40 mg by mouth daily.   6   No current facility-administered medications for this encounter.    REVIEW OF SYSTEMS:  On review of systems, the patient reports that he is doing well overall. He denies any chest pain, shortness of breath, cough, fevers, chills, night sweats, unintended weight changes. He denies any bowel disturbances, and denies abdominal pain, nausea or vomiting. He denies any new musculoskeletal or joint aches or pains. His IPSS was 3, indicating minimal urinary symptoms. His SHIM was 11, indicating he has moderate-severe erectile dysfunction. A complete review of systems is obtained and is otherwise negative.    PHYSICAL EXAM:  Wt Readings from Last 3 Encounters:  11/09/24 180 lb (81.6 kg)  12/12/23 184 lb  (83.5 kg)  05/22/23 184 lb (83.5 kg)   Temp Readings from Last 3 Encounters:  11/09/24 97.8 F (36.6 C)  05/22/23 (!) 97.5 F (36.4 C)  09/05/21 97.8 F (36.6 C) (Oral)   BP Readings from Last 3 Encounters:  11/09/24 132/81  12/12/23 105/60  05/22/23 (!) 118/51   Pulse Readings from Last 3 Encounters:  11/09/24 63  12/12/23 (!) 53  05/22/23 (!) 49   Pain Assessment Pain Score: 0-No pain/10  In general this is a well appearing Caucasian  male in no acute distress. He's alert and oriented x4 and appropriate throughout the examination. Cardiopulmonary assessment is negative for acute distress, and he exhibits normal effort.     KPS = 100  100 - Normal; no complaints; no evidence of disease. 90   - Able to carry on normal activity; minor signs or symptoms of disease. 80   - Normal activity with effort; some signs or symptoms of disease. 40   - Cares for self; unable to carry on normal activity or to do active work. 60   - Requires occasional assistance, but is able to care for most of his personal needs. 50   - Requires considerable assistance and frequent medical care. 40   - Disabled; requires special care and assistance. 30   - Severely disabled; hospital admission is indicated although death not imminent. 20   - Very sick; hospital admission necessary; active supportive treatment necessary. 10   - Moribund; fatal processes progressing rapidly. 0     - Dead  Karnofsky DA, Abelmann WH, Craver LS and Burchenal [facility] [phone]) The use of the nitrogen mustards in the palliative treatment of carcinoma: with particular reference to bronchogenic carcinoma Cancer 1 634-56  LABORATORY DATA:  Lab Results  Component Value Date   WBC 5.0 09/05/2021   HGB 12.5 (L) 09/05/2021   HCT 37.3 (L) 09/05/2021   MCV 94.7 09/05/2021   PLT 177 09/05/2021   Lab Results  Component Value Date   NA 138 09/05/2021   K 3.6 09/05/2021   CL 100 09/05/2021   CO2 25 09/05/2021   Lab Results   Component Value Date   ALT 17 09/05/2021   AST 19 09/05/2021   ALKPHOS 59 09/05/2021   BILITOT 1.2 09/05/2021     RADIOGRAPHY: No results found.    IMPRESSION/PLAN: 1. 68 y.o. gentleman with Stage T1c adenocarcinoma of the prostate with Gleason score of 3+4, and PSA of 10.8.  We discussed the patient's workup and outlined the nature of prostate cancer in this setting. The patient's T stage, Gleason's score, and PSA put him into the intermediate risk group. Accordingly, he is eligible for a variety of potential treatment options including continued active surveillance, brachytherapy, 5.5 weeks of external radiation or prostatectomy. We discussed the available radiation techniques, and focused on the details and logistics of delivery. We discussed and outlined the risks, benefits, short and long-term effects associated with radiotherapy and compared and contrasted these with prostatectomy. We discussed the role of SpaceOAR gel in reducing the rectal toxicity associated with radiotherapy.  He appears to have a good understanding of his disease and our treatment recommendations which are of curative intent.  He and his family were encouraged to ask questions that were answered to their stated satisfaction.  At the conclusion of our conversation, the patient is undecided between RALP vs 5.5 weeks of daily external beam radiation. We will share our discussion with Dr. Alvaro. If he chooses daily radiation, he would like to have that done at [facility] since that is closer to his home in [place]. He will take some time to consider his options and has our contact information so that he can let us  know if he decides to proceed with radiation and we will move forward with treatment planning accordingly at that time. We enjoyed meeting him and his family and look forward to continuing to participate in his care.  We personally spent 70 minutes in this encounter including chart review, reviewing  radiological studies,  meeting face-to-face with the patient, entering orders and completing documentation.    Sabra MICAEL Rusk, PA-C    Donnice Barge, MD  [facility]  Radiation Oncology Direct Dial: [phone]  Fax: [phone] [email or web address]  Skype  [email or web address]   This document serves as a record of services personally performed by Donnice Barge, MD and Sabra Rusk, PA-C. It was created on their behalf by Izetta Neither, a trained medical scribe. The creation of this record is based on the scribe's personal observations and the provider's statements to them. This document has been checked and approved by the attending provider.

## 2024-11-16 NOTE — Progress Notes (Signed)
 RN left message for call back to review any treatment decisions or questions since consult on 12/1.  Patient does have an appointment on 12/10 with [facility].

## 2024-11-18 DIAGNOSIS — C61 Malignant neoplasm of prostate: Secondary | ICD-10-CM | POA: Diagnosis not present

## 2024-11-18 DIAGNOSIS — Z8546 Personal history of malignant neoplasm of prostate: Secondary | ICD-10-CM | POA: Diagnosis not present

## 2024-11-24 NOTE — Progress Notes (Signed)
 Patient had second opinion with [facility] and met with Dr. Alvaro today, 12/16.  Patient remains undecided with treatment decision at this time.  Will continue to follow up.

## 2024-12-08 NOTE — Progress Notes (Signed)
 RN left message for call back to review possible treatment decision or answer any additional questions.

## 2024-12-16 NOTE — Progress Notes (Signed)
 " [facility] [address] [place]. [place] [address] [phone]       OFFICE FOLLOW UP NOTE  [name] Date of Birth:  [birth year] Medical Record Number:  [id number]    Primary neurologist: Dr. Chalice Reason for visit: CPAP follow-up    SUBJECTIVE:   CHIEF COMPLAINT:  Chief Complaint  Patient presents with   Follow-up    Patient in room 3 alone.  Patient is here for cpap follow up, patient states no issues or concerns at the moment, doing good ESS score is 3     Follow-up visit:  Prior visit: 12/12/2023  Brief HPI:   Ronnie Brewer is a 69 y.o. male who is followed for OSA on CPAP.  Initially diagnosed with sleep apnea in 2019 and placed on CPAP therapy.  Repeat HST 05/2023 confirmed presence of mild OSA with total AHI 12.7/h associated with significant bradycardia, mild hypoxia and strong supine position dependency.  Recommended continuation of autoPAP therapy, received new autoPAP 09/2023.     Interval history:  CPAP compliance report shows excellent usage and optimal residual AHI.  Continues to tolerate CPAP well.  Sleeps well at night and reports adequate daytime energy levels. ESS 3/24.  Follows with DME [employer]. No questions or concerns today.          ROS:   14 system review of systems performed and negative with exception of those listed in HPI  PMH:  Past Medical History:  Diagnosis Date   Anxiety    Chronic kidney disease    hx kidney stones- 2014   Diabetes mellitus without complication (HCC)    Elevated PSA    High cholesterol    Hypertension    Prostate cancer (HCC)    Sleep apnea    wears CPAP    PSH:  Past Surgical History:  Procedure Laterality Date   arm surgery     1981   COLONOSCOPY     PROSTATE BIOPSY      Social History:  Social History   Socioeconomic History   Marital status: Married    Spouse name: Not on file   Number of children: Not on file   Years of education: Not on  file   Highest education level: Not on file  Occupational History    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  Tobacco Use   Smoking status: Never   Smokeless tobacco: Never  Vaping Use   Vaping status: Never Used  Substance and Sexual Activity   Alcohol  use: No   Drug use: No   Sexual activity: Not Currently  Other Topics Concern   Not on file  Social History Narrative   Not on file   Social Drivers of Health   Tobacco Use: Low Risk (12/17/2024)   Patient History    Smoking Tobacco Use: Never    Smokeless Tobacco Use: Never    Passive Exposure: Not on file  Financial Resource Strain: Not on file  Food Insecurity: No Food Insecurity (11/09/2024)   Epic    Worried About Programme Researcher, Broadcasting/film/video in the Last Year: Never true    Ran Out of Food in the Last Year: Never true  Transportation Needs: No Transportation Needs (11/09/2024)   Epic    Lack of Transportation (Medical): No    Lack of Transportation (Non-Medical): No  Physical Activity: Not on file  Stress: Not on file  Social Connections: Not on file  Intimate Partner Violence: Not At Risk (11/09/2024)  Epic    Fear of Current or Ex-Partner: No    Emotionally Abused: No    Physically Abused: No    Sexually Abused: No  Depression (PHQ2-9): Low Risk (11/09/2024)   Depression (PHQ2-9)    PHQ-2 Score: 0  Alcohol  Screen: Low Risk (11/09/2024)   Alcohol  Screen    Last Alcohol  Screening Score (AUDIT): 0  Housing: Low Risk (11/09/2024)   Epic    Unable to Pay for Housing in the Last Year: No    Number of Times Moved in the Last Year: 0    Homeless in the Last Year: No  Utilities: Not At Risk (11/09/2024)   Epic    Threatened with loss of utilities: No  Health Literacy: Not on file    Family History:  Family History  Problem Relation Age of Onset   Colon cancer Mother    Hypertension Mother    Hypertension Father    Nephrolithiasis Father    Ulcers Father    Diabetes Mellitus II Father    Esophageal cancer Neg Hx    Rectal  cancer Neg Hx    Stomach cancer Neg Hx     Medications:   Current Outpatient Medications on File Prior to Visit  Medication Sig Dispense Refill   aspirin EC 81 MG tablet Take 81 mg by mouth daily.     atorvastatin (LIPITOR) 80 MG tablet Take 80 mg by mouth daily.     FLUZONE HIGH-DOSE 0.5 ML injection      HYDROcodone-acetaminophen  (NORCO/VICODIN) 5-325 MG tablet Take 1-2 tablets by mouth every 6 (six) hours as needed for severe pain (pain score 7-10).     JANUVIA 100 MG tablet Take 100 mg by mouth daily.     JARDIANCE 10 MG TABS tablet Take 10 mg by mouth daily.     metFORMIN (GLUCOPHAGE) 500 MG tablet Take 500 mg by mouth in the morning, at noon, and at bedtime.     Multiple Vitamin (MULTIVITAMIN WITH MINERALS) TABS Take 1 tablet by mouth daily.     PARoxetine (PAXIL) 20 MG tablet Take 20 mg by mouth every morning.     telmisartan (MICARDIS) 80 MG tablet Take 40 mg by mouth daily.   6   No current facility-administered medications on file prior to visit.    Allergies:   Allergies  Allergen Reactions   Codeine Nausea And Vomiting   Colesevelam Other (See Comments)      OBJECTIVE:  Physical Exam  Vitals:   12/17/24 0841  BP: (!) 110/58  Pulse: 82  Weight: 185 lb 6.4 oz (84.1 kg)  Height: 6' (1.829 m)    Body mass index is 25.14 kg/m. No results found.   General: well developed, well nourished, very pleasant middle-age male, seated, in no evident distress Head: head normocephalic and atraumatic.   Neck: supple with no carotid or supraclavicular bruits Cardiovascular: regular rate and rhythm, no murmurs  Neurologic Exam Mental Status: Awake and fully alert. Oriented to place and time. Recent and remote memory intact. Attention span, concentration and fund of knowledge appropriate. Mood and affect appropriate.  Cranial Nerves: Pupils equal, briskly reactive to light. Extraocular movements full without nystagmus. Visual fields full to confrontation. Hearing intact.  Facial sensation intact. Face, tongue, palate moves normally and symmetrically.  Motor: Normal bulk and tone. Normal strength in all tested extremity muscles Gait and Station: Arises from chair without difficulty. Stance is normal. Gait demonstrates normal stride length and balance without use of AD.  ASSESSMENT/PLAN: Ronnie Brewer is a 69 y.o. year old male    OSA on CPAP :  Compliance report shows satisfactory usage with optimal residual AHI.   Continue current pressure settings 6-12 with EPR 3 Discussed continued nightly usage with ensuring greater than 4 hours nightly for optimal benefit and per insurance purposes  Continue to follow with DME company [employer] for any needed supplies or CPAP related concerns CPAP set up 09/2023     Follow up in 1 year (in office per patient request) or call earlier if needed   CC:  PCP: Avva, Ravisankar, MD      Harlene Bogaert, AGNP-BC  [facility] [address] [place], [place] [address]  Phone [phone] Fax [phone] Note: This document was prepared with digital dictation and possible smart phrase technology. Any transcriptional errors that result from this process are unintentional.      "

## 2024-12-17 ENCOUNTER — Ambulatory Visit: Payer: PPO | Admitting: Adult Health

## 2024-12-17 ENCOUNTER — Encounter: Payer: Self-pay | Admitting: Adult Health

## 2024-12-17 VITALS — BP 110/58 | HR 82 | Ht 72.0 in | Wt 185.4 lb

## 2024-12-17 DIAGNOSIS — G4733 Obstructive sleep apnea (adult) (pediatric): Secondary | ICD-10-CM

## 2024-12-17 NOTE — Progress Notes (Signed)
 SABRA

## 2024-12-17 NOTE — Patient Instructions (Addendum)
 Your Plan:  Continue nightly use of CPAP for adequate sleep apnea management  Continue to follow with DME [employer] for any needed supplies or CPAP related concerns     Follow-up in 1 year or call earlier if needed     Thank you for coming to see us  at [facility]. I hope we have been able to provide you high quality care today.  You may receive a patient satisfaction survey over the next few weeks. We would appreciate your feedback and comments so that we may continue to improve ourselves and the health of our patients.

## 2025-01-01 NOTE — Progress Notes (Signed)
 Multiple attempts have been made to contact the patient via telephone regarding treatment decision and to address any questions or concerns; no return calls received. Due to inability to reach the patient by phone, a letter was mailed to the patients address on file requesting that he contact the office to discuss treatment options and next steps. Will await patient response.

## 2025-12-20 ENCOUNTER — Ambulatory Visit: Admitting: Adult Health
